# Patient Record
Sex: Male | Born: 1965 | Race: White | Hispanic: No | Marital: Married | Smoking: Current every day smoker
Health system: Southern US, Community
[De-identification: ages and names within clinical notes are randomized; demographics above are authoritative.]

## PROBLEM LIST (undated history)

## (undated) DIAGNOSIS — B182 Chronic viral hepatitis C: Secondary | ICD-10-CM

## (undated) DIAGNOSIS — M1990 Unspecified osteoarthritis, unspecified site: Secondary | ICD-10-CM

## (undated) HISTORY — PX: WRIST ARTHROSCOPY: SUR100

## (undated) HISTORY — DX: Chronic viral hepatitis C: B18.2

## (undated) HISTORY — PX: WRIST SURGERY: SHX841

## (undated) HISTORY — PX: HERNIA REPAIR: SHX51

---

## 2016-01-10 ENCOUNTER — Ambulatory Visit (HOSPITAL_COMMUNITY)
Admission: EM | Admit: 2016-01-10 | Discharge: 2016-01-10 | Disposition: A | Discharge status: Home / Self Care | Payer: Medicaid Other | Attending: Emergency Medicine | Admitting: Emergency Medicine

## 2016-01-10 ENCOUNTER — Encounter (HOSPITAL_COMMUNITY): Payer: Self-pay | Admitting: Emergency Medicine

## 2016-01-10 DIAGNOSIS — M7122 Synovial cyst of popliteal space [Baker], left knee: Secondary | ICD-10-CM | POA: Diagnosis not present

## 2016-01-10 DIAGNOSIS — L409 Psoriasis, unspecified: Secondary | ICD-10-CM | POA: Diagnosis not present

## 2016-01-10 HISTORY — DX: Unspecified osteoarthritis, unspecified site: M19.90

## 2016-01-10 NOTE — Discharge Instructions (Signed)
Baker Cyst °A Baker cyst is a sac-like structure that forms in the back of the knee. It is filled with the same fluid that is located in your knee. This fluid lubricates the bones and cartilage of the knee and allows them to move over each other more easily. °CAUSES  °When the knee becomes injured or inflamed, increased fluid forms in the knee. When this happens, the joint lining is pushed out behind the knee and forms the Baker cyst. This cyst may also be caused by inflammation from arthritic conditions and infections. °SIGNS AND SYMPTOMS  °A Baker cyst usually has no symptoms. When the cyst is substantially enlarged: °· You may feel pressure behind the knee, stiffness in the knee, or a mass in the area behind the knee. °· You may develop pain, redness, and swelling in the calf.  This can suggest a blood clot and requires evaluation by your health care provider. °DIAGNOSIS  °A Baker cyst is most often found during an ultrasound exam. This exam may have been performed for other reasons, and the cyst was found incidentally. Sometimes an MRI is used. This picks up other problems within a joint that an ultrasound exam may not. If the Baker cyst developed immediately after an injury, X-ray exams may be used to diagnose the cyst. °TREATMENT  °The treatment depends on the cause of the cyst. Anti-inflammatory medicines and rest often will be prescribed. If the cyst is caused by a bacterial infection, antibiotic medicines may be prescribed.  °HOME CARE INSTRUCTIONS  °· If the cyst was caused by an injury, for the first 24 hours, keep the injured leg elevated on 2 pillows while lying down. °· For the first 24 hours while you are awake, apply ice to the injured area: °¨ Put ice in a plastic bag. °¨ Place a towel between your skin and the bag. °¨ Leave the ice on for 20 minutes, 2-3 times a day. °· Only take over-the-counter or prescription medicines for pain, discomfort, or fever as directed by your health care  provider. °· Only take antibiotic medicine as directed. Make sure to finish it even if you start to feel better. °MAKE SURE YOU:  °· Understand these instructions. °· Will watch your condition. °· Will get help right away if you are not doing well or get worse. °  °This information is not intended to replace advice given to you by your health care provider. Make sure you discuss any questions you have with your health care provider. °  °Document Released: 08/23/2005 Document Revised: 06/13/2013 Document Reviewed: 04/04/2013 °Elsevier Interactive Patient Education ©2016 [employer] ° °

## 2016-01-10 NOTE — ED Provider Notes (Signed)
CSN: [id number]     Arrival date & time 01/10/16  1315 History   First MD Initiated Contact with Patient 01/10/16 1440     Chief Complaint  Patient presents with  . Mass   (Consider location/radiation/quality/duration/timing/severity/associated sxs/prior Treatment) HPI Comments: Patient presents with left knee "lump" x 4 months. Minimal pain, concerned. No decrease in ROM. History of multiple joint pain. Known psoriasis.   The history is provided by the patient.    Past Medical History  Diagnosis Date  . Arthritis    History reviewed. No pertinent past surgical history. No family history on file. Social History  Substance Use Topics  . Smoking status: Current Every Day Smoker  . Smokeless tobacco: None  . Alcohol Use: Yes    Review of Systems  Constitutional: Negative for fever.  Musculoskeletal: Positive for back pain, joint swelling and arthralgias.  Skin: Positive for rash.       Longstanding psoriasis    Allergies  Review of patient's allergies indicates not on file.  Home Medications   Prior to Admission medications   Not on File   Meds Ordered and Administered this Visit  Medications - No data to display  BP 141/69 mmHg  Pulse 72  Temp(Src) 97.9 F (36.6 C) (Oral)  Resp 16  SpO2 100% No data found.   Physical Exam  Constitutional: He is oriented to person, place, and time. He appears well-developed and well-nourished. No distress.  Very anxious, would not sit, kept wandering out into the hall  Musculoskeletal: He exhibits edema and tenderness.  Left tennis ball sized baker's cyst to the posterior knee, no warmth or erythema, no joint effusion, synovitis in the hands and ankles  Neurological: He is alert and oriented to person, place, and time.  Skin: Skin is warm. Rash noted. He is not diaphoretic.  Plaque psoriasis to most extensor surfaces visible by exam  Psychiatric:  Anxious appearing  Nursing note and vitals reviewed.   ED Course   Procedures (including critical care time)  Labs Review Labs Reviewed - No data to display  Imaging Review No results found.   Visual Acuity Review  Right Eye Distance:   Left Eye Distance:   Bilateral Distance:    Right Eye Near:   Left Eye Near:    Bilateral Near:         MDM   1. Baker's cyst of knee, left   2. Psoriasis    Orthopedic information given for drain vs. Surgical options. No emergent needs and no signs of infection.  2. Severe and probable associated PsA by exam. Recommend rheumatology, but patient is having insurance issues and states he will "do this later".     Bjorn Pippin, PA-C 01/10/16 1558

## 2016-01-10 NOTE — ED Notes (Signed)
Pt. Stated, I've had this lump behind left knee for 4 months. No pain associated.

## 2016-06-04 ENCOUNTER — Emergency Department (HOSPITAL_COMMUNITY)
Admission: EM | Admit: 2016-06-04 | Discharge: 2016-06-04 | Disposition: A | Discharge status: Home / Self Care | Payer: Medicaid Other | Attending: Emergency Medicine | Admitting: Emergency Medicine

## 2016-06-04 ENCOUNTER — Encounter (HOSPITAL_COMMUNITY): Payer: Self-pay | Admitting: Emergency Medicine

## 2016-06-04 DIAGNOSIS — F17210 Nicotine dependence, cigarettes, uncomplicated: Secondary | ICD-10-CM | POA: Diagnosis not present

## 2016-06-04 DIAGNOSIS — J0100 Acute maxillary sinusitis, unspecified: Secondary | ICD-10-CM | POA: Diagnosis not present

## 2016-06-04 DIAGNOSIS — R0981 Nasal congestion: Secondary | ICD-10-CM | POA: Diagnosis present

## 2016-06-04 MED ORDER — AMOXICILLIN 500 MG PO CAPS: 500.0000 mg | ORAL_CAPSULE | Freq: Three times a day (TID) | ORAL | 0 refills | Status: DC

## 2016-06-04 NOTE — ED Triage Notes (Signed)
Pt reports head congestion x 2.5 weeks. Pt reports fever at first, none today.

## 2016-06-05 NOTE — ED Provider Notes (Signed)
[facility] DEPT Provider Note   CSN: [id number] Arrival date & time: 06/04/16  1314     History   Chief Complaint Chief Complaint  Patient presents with  . Nasal Congestion    HPI Drew Kent is a 50 y.o. male.  The history is provided by the patient. No language interpreter was used.  Cough  This is a new problem. The problem occurs constantly. The cough is non-productive. There has been no fever. Associated symptoms include sore throat. He has tried nothing for the symptoms. He is a smoker. His past medical history does not include asthma.  Pt complains of sinus drainage and congestion,    Past Medical History:  Diagnosis Date  . Arthritis     There are no active problems to display for this patient.   Past Surgical History:  Procedure Laterality Date  . HERNIA REPAIR    . WRIST ARTHROSCOPY         Home Medications    Prior to Admission medications   Medication Sig Start Date End Date Taking? Authorizing Provider  amoxicillin (AMOXIL) 500 MG capsule Take 1 capsule (500 mg total) by mouth 3 (three) times daily. 06/04/16   Fransico Meadow, PA-C    Family History Family History  Problem Relation Age of Onset  . Heart failure Mother   . Cancer Other   . Diabetes Other   . Heart failure Other     Social History Social History  Substance Use Topics  . Smoking status: Current Some Day Smoker    Types: Cigarettes  . Smokeless tobacco: Never Used  . Alcohol use Yes     Comment: occas     Allergies   Review of patient's allergies indicates no known allergies.   Review of Systems Review of Systems  HENT: Positive for facial swelling, sinus pressure and sore throat.   Respiratory: Positive for cough.   All other systems reviewed and are negative.    Physical Exam Updated Vital Signs BP (!) 158/106 (BP Location: Left Arm) Comment: pt reports does not take bp medication regularly.  Pulse 83   Temp 98.9 F (37.2 C) (Oral)   Resp 18   Ht 6'  2.5" (1.892 m)   Wt 90.7 kg   SpO2 100%   BMI 25.33 kg/m   Physical Exam  Constitutional: He appears well-developed and well-nourished.  HENT:  Head: Normocephalic.  Nose: Nose normal.  Tender maxillary sinuses,     Eyes: Conjunctivae are normal.  Neck: Neck supple.  Cardiovascular: Normal rate and regular rhythm.   No murmur heard. Pulmonary/Chest: Effort normal and breath sounds normal. No respiratory distress.  Abdominal: Soft. There is no tenderness.  Musculoskeletal: He exhibits no edema.  Neurological: He is alert.  Skin: Skin is warm and dry.  Psychiatric: He has a normal mood and affect.  Nursing note and vitals reviewed.    ED Treatments / Results  Labs (all labs ordered are listed, but only abnormal results are displayed) Labs Reviewed - No data to display  EKG  EKG Interpretation None       Radiology No results found.  Procedures Procedures (including critical care time)  Medications Ordered in ED Medications - No data to display   Initial Impression / Assessment and Plan / ED Course  I have reviewed the triage vital signs and the nursing notes.  Pertinent labs & imaging results that were available during my care of the patient were reviewed by me and considered in my  medical decision making (see chart for details).  Clinical Course      Final Clinical Impressions(s) / ED Diagnoses   Final diagnoses:  Acute maxillary sinusitis, recurrence not specified    New Prescriptions Discharge Medication List as of 06/04/2016  2:46 PM    START taking these medications   Details  amoxicillin (AMOXIL) 500 MG capsule Take 1 capsule (500 mg total) by mouth 3 (three) times daily., Starting Fri 06/04/2016, Print         Hollace Kinnier Crook, PA-C 06/05/16 [id number]    Dorie Rank, MD 06/06/16 [phone]

## 2018-09-04 ENCOUNTER — Encounter (HOSPITAL_COMMUNITY): Payer: Self-pay

## 2018-09-04 ENCOUNTER — Emergency Department (HOSPITAL_COMMUNITY)
Admission: EM | Admit: 2018-09-04 | Discharge: 2018-09-04 | Disposition: A | Discharge status: Home / Self Care | Payer: Medicaid Other | Attending: Emergency Medicine | Admitting: Emergency Medicine

## 2018-09-04 ENCOUNTER — Other Ambulatory Visit: Payer: Self-pay

## 2018-09-04 DIAGNOSIS — R21 Rash and other nonspecific skin eruption: Secondary | ICD-10-CM

## 2018-09-04 DIAGNOSIS — F17210 Nicotine dependence, cigarettes, uncomplicated: Secondary | ICD-10-CM | POA: Insufficient documentation

## 2018-09-04 MED ORDER — TRIAMCINOLONE ACETONIDE 0.1 % EX CREA: 1.0000 "application " | TOPICAL_CREAM | Freq: Two times a day (BID) | CUTANEOUS | 0 refills | Status: DC

## 2018-09-04 MED ORDER — HYDROXYZINE HCL 25 MG PO TABS: 25.0000 mg | ORAL_TABLET | Freq: Four times a day (QID) | ORAL | 0 refills | Status: DC | PRN

## 2018-09-04 MED ORDER — HYDROXYZINE HCL 25 MG PO TABS
25.0000 mg | ORAL_TABLET | Freq: Once | ORAL | Status: AC
  Administered 2018-09-04: 25 mg via ORAL
  Filled 2018-09-04: qty 1

## 2018-09-04 MED ORDER — DEXAMETHASONE 4 MG PO TABS
12.0000 mg | ORAL_TABLET | Freq: Once | ORAL | Status: AC
  Administered 2018-09-04: 12 mg via ORAL
  Filled 2018-09-04: qty 3

## 2018-09-04 NOTE — ED Triage Notes (Signed)
Pt reports generalized rash off and on for the past 6 or 7 years.  Reports came from exposure to chemical when working years ago.    Reports drinking etoh makes it worse.  Pt currently at [facility] and they wanted him evaluated before he returned.

## 2018-09-04 NOTE — ED Provider Notes (Signed)
[facility] EMERGENCY DEPARTMENT Provider Note   CSN: [id number] Arrival date & time: 09/04/18  1648     History   Chief Complaint Chief Complaint  Patient presents with  . Rash    HPI Drew Kent is a 52 y.o. male.  HPI   52 year old male with rash.  He reports that he has had this rash has been waxing and waning over the past 6 to 7 years.  Primarily on his elbows, forearms and knees.  He attributes this to a chemical exposure at work many years ago.  He feels like it gets worse when he drinks alcohol.  He is please try to get alcohol rehab and they wanted him evaluated.  He does report that his itching is been worse within the past several days.  No new exposures that he is aware of.  Past Medical History:  Diagnosis Date  . Arthritis     There are no active problems to display for this patient.   Past Surgical History:  Procedure Laterality Date  . HERNIA REPAIR    . WRIST ARTHROSCOPY          Home Medications    Prior to Admission medications   Medication Sig Start Date End Date Taking? Authorizing Provider  amoxicillin (AMOXIL) 500 MG capsule Take 1 capsule (500 mg total) by mouth 3 (three) times daily. 06/04/16   Fransico Meadow, PA-C    Family History Family History  Problem Relation Age of Onset  . Heart failure Mother   . Cancer Other   . Diabetes Other   . Heart failure Other     Social History Social History   Tobacco Use  . Smoking status: Current Some Day Smoker    Types: Cigarettes  . Smokeless tobacco: Never Used  Substance Use Topics  . Alcohol use: Yes    Comment: heavily, none in 3 weeks  . Drug use: No     Allergies   Patient has no known allergies.   Review of Systems Review of Systems   Physical Exam Updated Vital Signs BP 112/81 (BP Location: Right Arm)   Pulse 83   Temp 98 F (36.7 C) (Oral)   Resp 19   Ht 6' 2.25" (1.886 m)   Wt 122.5 kg   SpO2 97%   BMI 34.43 kg/m   Physical Exam Vitals signs and  nursing note reviewed.  Constitutional:      General: He is not in acute distress.    Appearance: He is well-developed.  HENT:     Head: Normocephalic and atraumatic.  Eyes:     General:        Right eye: No discharge.        Left eye: No discharge.     Conjunctiva/sclera: Conjunctivae normal.  Neck:     Musculoskeletal: Neck supple.  Cardiovascular:     Rate and Rhythm: Normal rate and regular rhythm.     Heart sounds: Normal heart sounds. No murmur. No friction rub. No gallop.   Pulmonary:     Effort: Pulmonary effort is normal. No respiratory distress.     Breath sounds: Normal breath sounds.  Abdominal:     General: There is no distension.     Palpations: Abdomen is soft.     Tenderness: There is no abdominal tenderness.  Musculoskeletal:        General: No tenderness.  Skin:    General: Skin is warm and dry.     Findings: Rash present.  Comments: Skin changes consistent with eczema elbows and bilateral knees.  More acute appearing lesions and excoriations to bilateral forearms.  No cellulitic changes.  Neurological:     Mental Status: He is alert.  Psychiatric:        Behavior: Behavior normal.        Thought Content: Thought content normal.      ED Treatments / Results  Labs (all labs ordered are listed, but only abnormal results are displayed) Labs Reviewed - No data to display  EKG None  Radiology No results found.  Procedures Procedures (including critical care time)  Medications Ordered in ED Medications - No data to display   Initial Impression / Assessment and Plan / ED Course  I have reviewed the triage vital signs and the nursing notes.  Pertinent labs & imaging results that were available during my care of the patient were reviewed by me and considered in my medical decision making (see chart for details).     52 year old male with a nonspecific dermatitis which appears to be superimposed on most likely psoriasis.  Plan symptomatic  treatment.  No obvious new exposures.  No systemic symptoms.  Final Clinical Impressions(s) / ED Diagnoses   Final diagnoses:  Rash    ED Discharge Orders    None       Virgel Manifold, MD 09/16/18 [phone]

## 2018-09-05 DIAGNOSIS — Z139 Encounter for screening, unspecified: Secondary | ICD-10-CM

## 2018-09-05 LAB — GLUCOSE, POCT (MANUAL RESULT ENTRY): POC Glucose: 99 mg/dl (ref 70–99)

## 2018-09-05 NOTE — Congregational Nurse Program (Addendum)
  Dept: [phone]   Congregational Nurse Program Note  Date of Encounter: 09/05/2018  Past Medical History: Past Medical History:  Diagnosis Date  . Arthritis     Encounter Details:  Pt presents to clinic to receive medical screening for PCP as a requirement by the [facility] to receive a physical exam per the facility   Vital signs checked;  Random Blood Glucose conducted (WNL); Pulse (ABN); Pt states he was excited; Temp (ABN) -pt states he had been experiening diarrhea like symptoms for 11 days most recently.   ABN Vitals were were reviewed with RN Case Manager.           Marland Kitchen      Referral to RN Case Manager, Mayra Reel was  provided to setup appointment for PCP and dental care services..   Development worker, community, Lynelle Smoke,  Conducted  financially elgibility for [employer] program.     Pt will be contacted back with a phone call to receive medical appt due to [facility] being  closed during the holidays.

## 2018-09-14 ENCOUNTER — Ambulatory Visit: Payer: Self-pay | Admitting: Physician Assistant

## 2018-09-27 ENCOUNTER — Ambulatory Visit: Payer: Self-pay | Admitting: Physician Assistant

## 2018-09-27 ENCOUNTER — Encounter: Payer: Self-pay | Admitting: Physician Assistant

## 2018-09-27 VITALS — BP 128/77 | HR 82 | Temp 97.7°F | Ht 73.0 in | Wt 290.0 lb

## 2018-09-27 DIAGNOSIS — E669 Obesity, unspecified: Secondary | ICD-10-CM

## 2018-09-27 DIAGNOSIS — L989 Disorder of the skin and subcutaneous tissue, unspecified: Secondary | ICD-10-CM

## 2018-09-27 DIAGNOSIS — Z125 Encounter for screening for malignant neoplasm of prostate: Secondary | ICD-10-CM

## 2018-09-27 DIAGNOSIS — Z7689 Persons encountering health services in other specified circumstances: Secondary | ICD-10-CM

## 2018-09-27 DIAGNOSIS — F17200 Nicotine dependence, unspecified, uncomplicated: Secondary | ICD-10-CM

## 2018-09-27 DIAGNOSIS — Z131 Encounter for screening for diabetes mellitus: Secondary | ICD-10-CM

## 2018-09-27 DIAGNOSIS — Z8619 Personal history of other infectious and parasitic diseases: Secondary | ICD-10-CM

## 2018-09-27 DIAGNOSIS — Z13220 Encounter for screening for lipoid disorders: Secondary | ICD-10-CM

## 2018-09-27 DIAGNOSIS — F1011 Alcohol abuse, in remission: Secondary | ICD-10-CM | POA: Insufficient documentation

## 2018-09-27 MED ORDER — TRIAMCINOLONE ACETONIDE 0.025 % EX OINT: 1.0000 "application " | TOPICAL_OINTMENT | Freq: Two times a day (BID) | CUTANEOUS | 0 refills | Status: DC

## 2018-09-27 NOTE — Progress Notes (Signed)
BP 128/77 (BP Location: Right Arm, Patient Position: Sitting, Cuff Size: Large)   Pulse 82   Temp 97.7 F (36.5 C) (Other (Comment))   Ht 6\' 1"  (1.854 m)   Wt 290 lb (131.5 kg)   SpO2 97%   BMI 38.26 kg/m    Subjective:    Patient ID: Drew Kent, male    DOB: 1965/09/16, 53 y.o.   MRN: [id number]  HPI: Jah Alarid is a 53 y.o. male presenting on 09/27/2018 for New Patient (Initial Visit)   HPI   Pt is here today to establish care.  Pt was at [facility] recently to get help with his alcoholism but is no longer there.  He is no longer there because he broke rules.  He is currently going to [facility] and is getting treatment.   He says currently he is not drinking.  Pt used cocaine in the past (including IVDU).  While he was in his 19s.    He has hepatitis C.  He was diagnosed about 7 years ago in [place].  He received no treatment at that time.   Relevant past medical, surgical, family and social history reviewed and updated as indicated. Interim medical history since our last visit reviewed. Allergies and medications reviewed and updated.  CURRENT MEDS: none  Review of Systems  Constitutional: Positive for fatigue. Negative for appetite change, chills, diaphoresis, fever and unexpected weight change.  HENT: Positive for dental problem. Negative for congestion, drooling, ear pain, facial swelling, hearing loss, mouth sores, sneezing, sore throat, trouble swallowing and voice change.   Eyes: Negative for pain, discharge, redness, itching and visual disturbance.  Respiratory: Negative for cough, choking, shortness of breath and wheezing.   Cardiovascular: Negative for chest pain, palpitations and leg swelling.  Gastrointestinal: Positive for abdominal pain. Negative for blood in stool, constipation, diarrhea and vomiting.  Endocrine: Negative for cold intolerance, heat intolerance and polydipsia.  Genitourinary: Negative for decreased urine volume, dysuria and hematuria.    Musculoskeletal: Positive for arthralgias and back pain. Negative for gait problem.  Skin: Positive for rash.  Allergic/Immunologic: Negative for environmental allergies.  Neurological: Negative for seizures, syncope, light-headedness and headaches.  Hematological: Negative for adenopathy.  Psychiatric/Behavioral: Negative for agitation, dysphoric mood and suicidal ideas. The patient is not nervous/anxious.     Per HPI unless specifically indicated above     Objective:    BP 128/77 (BP Location: Right Arm, Patient Position: Sitting, Cuff Size: Large)   Pulse 82   Temp 97.7 F (36.5 C) (Other (Comment))   Ht 6\' 1"  (1.854 m)   Wt 290 lb (131.5 kg)   SpO2 97%   BMI 38.26 kg/m   Wt Readings from Last 3 Encounters:  09/27/18 290 lb (131.5 kg)  09/05/18 280 lb (127 kg)  09/04/18 270 lb (122.5 kg)    Physical Exam Vitals signs reviewed.  Constitutional:      Appearance: He is well-developed.  HENT:     Head: Normocephalic and atraumatic.     Mouth/Throat:     Pharynx: No oropharyngeal exudate.  Eyes:     Conjunctiva/sclera: Conjunctivae normal.     Pupils: Pupils are equal, round, and reactive to light.  Neck:     Musculoskeletal: Neck supple.     Thyroid: No thyromegaly.  Cardiovascular:     Rate and Rhythm: Normal rate and regular rhythm.  Pulmonary:     Effort: Pulmonary effort is normal.     Breath sounds: Normal breath sounds. No wheezing  or rales.  Abdominal:     General: Bowel sounds are normal.     Palpations: Abdomen is soft. There is no mass.     Tenderness: There is no abdominal tenderness.  Lymphadenopathy:     Cervical: No cervical adenopathy.  Skin:    General: Skin is warm and dry.     Findings: Rash present. Rash is crusting and scaling.          Comments: Extensive plaque eruption.  As per diagram. Also in/on ears.  Face mostly spared  Neurological:     Mental Status: He is alert and oriented to person, place, and time.  Psychiatric:         Behavior: Behavior normal.        Thought Content: Thought content normal.         Assessment & Plan:   Encounter Diagnoses  Name Primary?  . Encounter to establish care Yes  . Alcohol abuse, in remission   . History of hepatitis C   . Psoriasis-like skin disease   . Obesity, unspecified classification, unspecified obesity type, unspecified whether serious comorbidity present   . Tobacco use disorder   . Screening cholesterol level   . Screening for diabetes mellitus   . Screening for prostate cancer     -will get Baseline labs -Refer to derm- eczema vs psoriasis -rx TAC ointment to use for now.  Counseled pt to stop using [place] soap and use Dove only as it is milder -pt to continue with AA for alcoholism -pt to follow up here 1 month.  RTO sooner prn

## 2018-10-10 ENCOUNTER — Other Ambulatory Visit: Payer: Self-pay

## 2018-10-10 ENCOUNTER — Emergency Department (HOSPITAL_COMMUNITY)
Admission: EM | Admit: 2018-10-10 | Discharge: 2018-10-10 | Disposition: A | Discharge status: Home / Self Care | Payer: Self-pay | Attending: Emergency Medicine | Admitting: Emergency Medicine

## 2018-10-10 ENCOUNTER — Encounter (HOSPITAL_COMMUNITY): Payer: Self-pay | Admitting: Emergency Medicine

## 2018-10-10 DIAGNOSIS — Y999 Unspecified external cause status: Secondary | ICD-10-CM | POA: Insufficient documentation

## 2018-10-10 DIAGNOSIS — S61412A Laceration without foreign body of left hand, initial encounter: Secondary | ICD-10-CM | POA: Insufficient documentation

## 2018-10-10 DIAGNOSIS — F17210 Nicotine dependence, cigarettes, uncomplicated: Secondary | ICD-10-CM | POA: Insufficient documentation

## 2018-10-10 DIAGNOSIS — Y93G1 Activity, food preparation and clean up: Secondary | ICD-10-CM | POA: Insufficient documentation

## 2018-10-10 DIAGNOSIS — Z23 Encounter for immunization: Secondary | ICD-10-CM | POA: Insufficient documentation

## 2018-10-10 DIAGNOSIS — Y929 Unspecified place or not applicable: Secondary | ICD-10-CM | POA: Insufficient documentation

## 2018-10-10 DIAGNOSIS — W269XXA Contact with unspecified sharp object(s), initial encounter: Secondary | ICD-10-CM | POA: Insufficient documentation

## 2018-10-10 MED ORDER — LIDOCAINE HCL (PF) 2 % IJ SOLN
INTRAMUSCULAR | Status: AC
  Filled 2018-10-10: qty 10

## 2018-10-10 MED ORDER — POVIDONE-IODINE 10 % EX SOLN
CUTANEOUS | Status: DC | PRN
  Administered 2018-10-10: 1 via TOPICAL

## 2018-10-10 MED ORDER — TETANUS-DIPHTH-ACELL PERTUSSIS 5-2.5-18.5 LF-MCG/0.5 IM SUSP
0.5000 mL | Freq: Once | INTRAMUSCULAR | Status: AC
  Administered 2018-10-10: 0.5 mL via INTRAMUSCULAR
  Filled 2018-10-10: qty 0.5

## 2018-10-10 MED ORDER — POVIDONE-IODINE 10 % EX SOLN
CUTANEOUS | Status: AC
  Filled 2018-10-10: qty 15

## 2018-10-10 MED ORDER — LIDOCAINE HCL (PF) 2 % IJ SOLN
5.0000 mL | Freq: Once | INTRAMUSCULAR | Status: AC
  Administered 2018-10-10: 5 mL via INTRADERMAL

## 2018-10-10 NOTE — ED Triage Notes (Signed)
Patient was shucking oysters and cut left hand between thumb and index finger, bleeding control, last Tetanus shot 8 years ago.

## 2018-10-10 NOTE — Discharge Instructions (Addendum)
Clean the wound with mild soap and water and keep it bandaged.  Avoid using your left hand.  Sutures out in 8 to 10 days.  Return here for any signs of infection such as increasing pain, redness or red streaking, swelling or drainage.  Elevate your hand tonight to help with pain and swelling.

## 2018-10-10 NOTE — ED Provider Notes (Signed)
[facility] EMERGENCY DEPARTMENT Provider Note   CSN: [id number] Arrival date & time: 10/10/18  2000     History   Chief Complaint Chief Complaint  Patient presents with  . Extremity Laceration    HPI Drew Kent is a 53 y.o. male.  HPI  Drew Kent is a 53 y.o. male who presents to the Emergency Department complaining of laceration to his left hand.  Incident occurred shortly before ER arrival.  States that he was shucking oysters and punctured the area between the thumb and index finger of the left hand.  He reports throbbing pain and small laceration.  Mild bleeding that has been controlled with direct pressure.  Last TD is unknown.  He denies possible FB's, numbness, weakness or difficulty moving the fingers of the affected hand. He does not take blood thinners   Past Medical History:  Diagnosis Date  . Arthritis     Patient Active Problem List   Diagnosis Date Noted  . 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 HERNIA REPAIR    . WRIST ARTHROSCOPY          Home Medications    Prior to Admission medications   Medication Sig Start Date End Date Taking? Authorizing Provider  triamcinolone (KENALOG) 0.025 % ointment Apply 1 application topically 2 (two) times daily. 09/27/18   Soyla Dryer, PA-C    Family History Family History  Problem Relation Age of Onset  . Heart failure Mother   . Heart disease Mother   . Cancer Other   . Diabetes Other   . Heart failure Other     Social History Social History   Tobacco Use  . Smoking status: Current Every Day Smoker    Packs/day: 0.50    Types: Cigarettes, E-cigarettes  . Smokeless tobacco: Never Used  Substance Use Topics  . Alcohol use: Yes    Comment: alcoholic  . Drug use: Yes    Types: Cocaine     Allergies   Patient has no known allergies.   Review of Systems Review of Systems  Constitutional: Negative for chills and fever.    Musculoskeletal: Negative for arthralgias and joint swelling.  Skin: Positive for wound.       Laceration left hand  Neurological: Negative for weakness and numbness.  Hematological: Does not bruise/bleed easily.     Physical Exam Updated Vital Signs BP 111/85 (BP Location: Right Arm)   Pulse 97   Temp 98.6 F (37 C) (Oral)   Resp 17   Ht 6\' 1"  (1.854 m)   Wt 131.5 kg   SpO2 98%   BMI 38.26 kg/m   Physical Exam Vitals signs and nursing note reviewed.  Constitutional:      General: He is not in acute distress.    Appearance: Normal appearance. He is well-developed.  HENT:     Head: Atraumatic.  Cardiovascular:     Rate and Rhythm: Normal rate and regular rhythm.     Pulses: Normal pulses.  Pulmonary:     Effort: Pulmonary effort is normal. No respiratory distress.     Breath sounds: Normal breath sounds.  Musculoskeletal: Normal range of motion.        General: Tenderness and signs of injury present. No swelling.     Left hand: He exhibits tenderness and laceration. He exhibits no bony tenderness, normal capillary refill, no deformity and no swelling. Normal sensation noted. Normal strength noted. He exhibits no  finger abduction and no wrist extension trouble.       Hands:     Comments: 1.5 cm laceration to the first web space of the left hand.  No edema, bleeding controlled prior to closure. Pt has full ROM of the fingers of the left hand.  No sensory deficits.  Skin:    General: Skin is warm.     Capillary Refill: Capillary refill takes less than 2 seconds.     Findings: No laceration.  Neurological:     General: No focal deficit present.     Mental Status: He is alert. Mental status is at baseline.     Sensory: Sensation is intact. No sensory deficit.     Motor: Motor function is intact. No abnormal muscle tone.      ED Treatments / Results  Labs (all labs ordered are listed, but only abnormal results are displayed) Labs Reviewed - No data to  display  EKG None  Radiology No results found.  Procedures Procedures (including critical care time)  LACERATION REPAIR Performed by: Rolla Kedzierski Authorized by: Elvira Langston Consent: Verbal consent obtained. Risks and benefits: risks, benefits and alternatives were discussed Consent given by: patient Patient identity confirmed: provided demographic data Prepped and Draped in normal sterile fashion Wound explored  Laceration Location: First webspace of the left hand  Laceration Length: 1.5 cm  No Foreign Bodies seen or palpated  Anesthesia: local infiltration  Local anesthetic: lidocaine 2% w/o epinephrine  Anesthetic total: 2 ml  Irrigation method: syringe Amount of cleaning: standard  Skin closure: 4-0 Ethilon  Number of sutures: 2  Technique: Simple interrupted  Patient tolerance: Patient tolerated the procedure well with no immediate complications.   Medications Ordered in ED Medications  Tdap (BOOSTRIX) injection 0.5 mL (has no administration in time range)  povidone-iodine (BETADINE) 10 % external solution (1 application Topical Given 10/10/18 2212)  lidocaine (XYLOCAINE) 2 % injection 5 mL (5 mLs Intradermal Given 10/10/18 2211)     Initial Impression / Assessment and Plan / ED Course  I have reviewed the triage vital signs and the nursing notes.  Pertinent labs & imaging results that were available during my care of the patient were reviewed by me and considered in my medical decision making (see chart for details).     Laceration to the first webspace of the left hand.  Bleeding controlled prior to closure.  Wound was irrigated and explored, no foreign body seen.  Neurovascularly intact.  No motor or sensory weakness or deficit.  TD updated.  Laceration was loosely approximated.  Patient agrees to wound care instructions and sutures out in 8 to 10 days.  Final Clinical Impressions(s) / ED Diagnoses   Final diagnoses:  Laceration of left hand  without foreign body, initial encounter    ED Discharge Orders    None       Kem Parkinson, PA-C 10/11/18 1340    Fredia Sorrow, MD 10/11/18 1547

## 2018-10-11 ENCOUNTER — Encounter: Payer: Self-pay | Admitting: Student

## 2018-10-26 ENCOUNTER — Other Ambulatory Visit (HOSPITAL_COMMUNITY)
Admission: RE | Admit: 2018-10-26 | Discharge: 2018-10-26 | Disposition: A | Discharge status: Home / Self Care | Payer: Self-pay | Source: Ambulatory Visit | Attending: Physician Assistant | Admitting: Physician Assistant

## 2018-10-26 DIAGNOSIS — Z8619 Personal history of other infectious and parasitic diseases: Secondary | ICD-10-CM | POA: Insufficient documentation

## 2018-10-26 DIAGNOSIS — F1011 Alcohol abuse, in remission: Secondary | ICD-10-CM | POA: Insufficient documentation

## 2018-10-26 DIAGNOSIS — Z125 Encounter for screening for malignant neoplasm of prostate: Secondary | ICD-10-CM | POA: Insufficient documentation

## 2018-10-26 DIAGNOSIS — Z131 Encounter for screening for diabetes mellitus: Secondary | ICD-10-CM | POA: Insufficient documentation

## 2018-10-26 DIAGNOSIS — Z13220 Encounter for screening for lipoid disorders: Secondary | ICD-10-CM | POA: Insufficient documentation

## 2018-10-26 LAB — LIPID PANEL
Cholesterol: 204 mg/dL — ABNORMAL HIGH (ref 0–200)
HDL: 35 mg/dL — ABNORMAL LOW
LDL Cholesterol: 154 mg/dL — ABNORMAL HIGH (ref 0–99)
Total CHOL/HDL Ratio: 5.8 ratio
Triglycerides: 77 mg/dL
VLDL: 15 mg/dL (ref 0–40)

## 2018-10-26 LAB — COMPREHENSIVE METABOLIC PANEL WITH GFR
ALT: 71 U/L — ABNORMAL HIGH (ref 0–44)
AST: 40 U/L (ref 15–41)
Albumin: 4.3 g/dL (ref 3.5–5.0)
Alkaline Phosphatase: 57 U/L (ref 38–126)
Anion gap: 8 (ref 5–15)
BUN: 17 mg/dL (ref 6–20)
CO2: 23 mmol/L (ref 22–32)
Calcium: 9.2 mg/dL (ref 8.9–10.3)
Chloride: 106 mmol/L (ref 98–111)
Creatinine, Ser: 1.16 mg/dL (ref 0.61–1.24)
GFR calc Af Amer: >60 mL/min
GFR calc non Af Amer: >60 mL/min
Glucose, Bld: 102 mg/dL — ABNORMAL HIGH (ref 70–99)
Potassium: 4.1 mmol/L (ref 3.5–5.1)
Sodium: 137 mmol/L (ref 135–145)
Total Bilirubin: 0.6 mg/dL (ref 0.3–1.2)
Total Protein: 7.8 g/dL (ref 6.5–8.1)

## 2018-10-26 LAB — HEMOGLOBIN A1C
Hgb A1c MFr Bld: 5.3 % (ref 4.8–5.6)
Mean Plasma Glucose: 105.41 mg/dL

## 2018-10-26 LAB — PSA: Prostatic Specific Antigen: 0.26 ng/mL (ref 0.00–4.00)

## 2018-10-27 LAB — HCV RNA QUANT

## 2018-10-27 LAB — HCV RNA (INTERNATIONAL UNITS)
HCV RNA (International Units): 12500000 IU/mL
HCV log10: 7.097 log10 IU/mL

## 2018-10-30 ENCOUNTER — Encounter: Payer: Self-pay | Admitting: Physician Assistant

## 2018-10-30 ENCOUNTER — Ambulatory Visit: Payer: Self-pay | Admitting: Physician Assistant

## 2018-10-30 VITALS — BP 136/86 | HR 86 | Temp 97.7°F | Ht 73.5 in | Wt 297.0 lb

## 2018-10-30 DIAGNOSIS — R945 Abnormal results of liver function studies: Secondary | ICD-10-CM

## 2018-10-30 DIAGNOSIS — L989 Disorder of the skin and subcutaneous tissue, unspecified: Secondary | ICD-10-CM

## 2018-10-30 DIAGNOSIS — E669 Obesity, unspecified: Secondary | ICD-10-CM

## 2018-10-30 DIAGNOSIS — E785 Hyperlipidemia, unspecified: Secondary | ICD-10-CM | POA: Insufficient documentation

## 2018-10-30 DIAGNOSIS — F1011 Alcohol abuse, in remission: Secondary | ICD-10-CM

## 2018-10-30 DIAGNOSIS — R7989 Other specified abnormal findings of blood chemistry: Secondary | ICD-10-CM

## 2018-10-30 DIAGNOSIS — B1920 Unspecified viral hepatitis C without hepatic coma: Secondary | ICD-10-CM | POA: Insufficient documentation

## 2018-10-30 DIAGNOSIS — F17200 Nicotine dependence, unspecified, uncomplicated: Secondary | ICD-10-CM

## 2018-10-30 NOTE — Patient Instructions (Signed)

## 2018-10-30 NOTE — Progress Notes (Signed)
BP 136/86 (BP Location: Left Arm, Patient Position: Sitting, Cuff Size: Large)   Pulse 86   Temp 97.7 F (36.5 C)   Ht 6' 1.5" (1.867 m)   Wt 297 lb (134.7 kg)   SpO2 98%   BMI 38.65 kg/m    Subjective:    Patient ID: Drew Kent, male    DOB: 30-Aug-1966, 53 y.o.   MRN: [id number]  HPI: Drew Kent is a 53 y.o. male presenting on 10/30/2018 for Follow-up   HPI  Pt says he has been to dermatology- was given coal tar soaps , etc  Relevant past medical, surgical, family and social history reviewed and updated as indicated. Interim medical history since our last visit reviewed. Allergies and medications reviewed and updated.   Current Outpatient Medications:  .  triamcinolone (KENALOG) 0.025 % ointment, Apply 1 application topically 2 (two) times daily., Disp: 454 g, Rfl: 0     Review of Systems  Constitutional: Negative for appetite change, chills, diaphoresis, fatigue, fever and unexpected weight change.  HENT: Positive for congestion. Negative for dental problem, drooling, ear pain, facial swelling, hearing loss, mouth sores, sneezing, sore throat, trouble swallowing and voice change.   Eyes: Negative for pain, discharge, redness, itching and visual disturbance.  Respiratory: Negative for cough, choking, shortness of breath and wheezing.   Cardiovascular: Negative for chest pain, palpitations and leg swelling.  Gastrointestinal: Negative for abdominal pain, blood in stool, constipation, diarrhea and vomiting.  Endocrine: Negative for cold intolerance, heat intolerance and polydipsia.  Genitourinary: Negative for decreased urine volume, dysuria and hematuria.  Musculoskeletal: Positive for arthralgias, back pain and gait problem.  Skin: Negative for rash.  Allergic/Immunologic: Negative for environmental allergies.  Neurological: Negative for seizures, syncope, light-headedness and headaches.  Hematological: Negative for adenopathy.  Psychiatric/Behavioral: Negative for  agitation, dysphoric mood and suicidal ideas. The patient is not nervous/anxious.     Per HPI unless specifically indicated above     Objective:    BP 136/86 (BP Location: Left Arm, Patient Position: Sitting, Cuff Size: Large)   Pulse 86   Temp 97.7 F (36.5 C)   Ht 6' 1.5" (1.867 m)   Wt 297 lb (134.7 kg)   SpO2 98%   BMI 38.65 kg/m   Wt Readings from Last 3 Encounters:  10/30/18 297 lb (134.7 kg)  10/10/18 290 lb (131.5 kg)  09/27/18 290 lb (131.5 kg)    Physical Exam Vitals signs reviewed.  Constitutional:      Appearance: He is well-developed.  HENT:     Head: Normocephalic and atraumatic.  Neck:     Musculoskeletal: Neck supple.  Cardiovascular:     Rate and Rhythm: Normal rate and regular rhythm.  Pulmonary:     Effort: Pulmonary effort is normal.     Breath sounds: Normal breath sounds. No wheezing.  Abdominal:     General: Bowel sounds are normal.     Palpations: Abdomen is soft.     Tenderness: There is no abdominal tenderness.  Lymphadenopathy:     Cervical: No cervical adenopathy.  Skin:    General: Skin is warm and dry.  Neurological:     Mental Status: He is alert and oriented to person, place, and time.  Psychiatric:        Behavior: Behavior normal.     Results for orders placed or performed during the hospital encounter of 10/26/18  Comprehensive metabolic panel  Result Value Ref Range   Sodium 137 135 - 145 mmol/L  Potassium 4.1 3.5 - 5.1 mmol/L   Chloride 106 98 - 111 mmol/L   CO2 23 22 - 32 mmol/L   Glucose, Bld 102 (H) 70 - 99 mg/dL   BUN 17 6 - 20 mg/dL   Creatinine, Ser 1.16 0.61 - 1.24 mg/dL   Calcium 9.2 8.9 - 10.3 mg/dL   Total Protein 7.8 6.5 - 8.1 g/dL   Albumin 4.3 3.5 - 5.0 g/dL   AST 40 15 - 41 U/L   ALT 71 (H) 0 - 44 U/L   Alkaline Phosphatase 57 38 - 126 U/L   Total Bilirubin 0.6 0.3 - 1.2 mg/dL   GFR calc non Af Amer >60 >60 mL/min   GFR calc Af Amer >60 >60 mL/min   Anion gap 8 5 - 15  Hemoglobin A1c  Result  Value Ref Range   Hgb A1c MFr Bld 5.3 4.8 - 5.6 %   Mean Plasma Glucose 105.41 mg/dL  PSA  Result Value Ref Range   Prostatic Specific Antigen 0.26 0.00 - 4.00 ng/mL  Lipid panel  Result Value Ref Range   Cholesterol 204 (H) 0 - 200 mg/dL   Triglycerides 77 <150 mg/dL   HDL 35 (L) >40 mg/dL   Total CHOL/HDL Ratio 5.8 RATIO   VLDL 15 0 - 40 mg/dL   LDL Cholesterol 154 (H) 0 - 99 mg/dL  HCV RNA quant  Result Value Ref Range   HCV Quantitative See Final Results >50 IU/mL   Test Information Comment   HCV RNA (International Units)  Result Value Ref Range   HCV RNA (International Units) 12,500,000 IU/mL   HCV log10 7.097 log10 IU/mL      Assessment & Plan:   Encounter Diagnoses  Name Primary?  . Hepatitis C virus infection without hepatic coma, unspecified chronicity Yes  . Tobacco use disorder   . Elevated LFTs   . Hyperlipidemia, unspecified hyperlipidemia type   . Alcohol abuse, in remission   . Obesity, unspecified classification, unspecified obesity type, unspecified whether serious comorbidity present   . Psoriasis-like skin disease     -reviewed labs with pt -Refer to GI for hep C -pt is given application for [facility] -counseled pt on Lowfat diet and exercise for lipids -counseled Smoking cessation -pt to follow up 3 months.  RTO sooner prn

## 2018-11-06 ENCOUNTER — Encounter: Payer: Self-pay | Admitting: Gastroenterology

## 2019-01-09 ENCOUNTER — Other Ambulatory Visit: Payer: Self-pay | Admitting: *Deleted

## 2019-01-09 ENCOUNTER — Encounter: Payer: Self-pay | Admitting: Gastroenterology

## 2019-01-09 ENCOUNTER — Telehealth: Payer: Self-pay | Admitting: *Deleted

## 2019-01-09 ENCOUNTER — Other Ambulatory Visit: Payer: Self-pay

## 2019-01-09 ENCOUNTER — Ambulatory Visit (INDEPENDENT_AMBULATORY_CARE_PROVIDER_SITE_OTHER): Payer: Self-pay | Admitting: Nurse Practitioner

## 2019-01-09 ENCOUNTER — Encounter: Payer: Self-pay | Admitting: Nurse Practitioner

## 2019-01-09 DIAGNOSIS — K625 Hemorrhage of anus and rectum: Secondary | ICD-10-CM | POA: Insufficient documentation

## 2019-01-09 DIAGNOSIS — B1920 Unspecified viral hepatitis C without hepatic coma: Secondary | ICD-10-CM

## 2019-01-09 MED ORDER — PEG 3350-KCL-NA BICARB-NACL 420 G PO SOLR: 4000.0000 mL | Freq: Once | ORAL | 0 refills | Status: AC

## 2019-01-09 NOTE — Progress Notes (Addendum)
REVIEWED-NO ADDITIONAL RECOMMENDATIONS.  Referring Provider: Soyla Dryer, PA-C Primary Care Physician:  Soyla Dryer, PA-C Primary GI:  Dr. Oneida Alar  NOTE: Service was provided via telemedicine and was requested by the patient due to COVID-19 pandemic.  Method of visit: Doxy.Me  Patient Location: Home  Provider Location: Office  Reason for Phone Visit: Consult from PCP  The patient was consented to phone follow-up via telephone encounter including billing of the encounter (yes/no): Yes  Persons present on the phone encounter, with roles: Wife  Total time (minutes) spent on medical discussion: 23 minutes  Chief Complaint  Patient presents with  . Hepatitis C    HPI:   Drew Kent is a 53 y.o. male who presents for virtual visit regarding: Referral from primary care for evaluation of hepatitis C.  Reviewed information provided with the referral including office visit with primary care due to 24 2020 for hepatitis C virus, elevated LFTs, alcohol abuse in remission, and others.  Recent LFTs found elevated ALT at 71, AST normal at 40, bilirubin 0.6, alk phos 57.  Hepatitis C antibody was positive and RNA at 12,500,000 copies.  No CBC in our system.  His BMI is 38.  He was counseled on low-fat diet and exercise.  No history of colonoscopy or endoscopy in our system.  Today he states he's doing well overall. Occasional morning-time abdominal discomfort that resolves when he gets up and moves around. Has had rare hematochezia since a child (as often as once every few months). Denies N/V, melena, fever, chills, unintentional weight loss. Denies yellowing of skin/eyes, darkened urine, acute episodic confusion, tremors/shakes. Denies URI and flu-like symptoms. Denies chest pain, dyspnea, dizziness, lightheadedness, syncope, near syncope. Denies any other upper or lower GI symptoms.  Hepatitis C Risk Factors:  Birth cohort ([place]): Yes IV drug use: Yes (last use 2010)  Tattoos: Yes (received in jail) Blood product transfusion: Yes (2008) HC worker: No Hemodialysis: No Maternal infection: No   Past Medical History:  Diagnosis Date  . Arthritis     Past Surgical History:  Procedure Laterality Date  . HERNIA REPAIR    . WRIST ARTHROSCOPY      Current Outpatient Medications  Medication Sig Dispense Refill  . triamcinolone (KENALOG) 0.025 % ointment Apply 1 application topically 2 (two) times daily. 454 g 0   No current facility-administered medications for this visit.     Allergies as of 01/09/2019  . (No Known Allergies)    Family History  Problem Relation Age of Onset  . Heart failure Mother   . Heart disease Mother   . Cancer Other   . Diabetes Other   . Heart failure Other     Social History   Socioeconomic History  . Marital status: Married    Spouse name: Not on file  . Number of children: Not on file  . Years of education: Not on file  . Highest education level: Not on file  Occupational History  . Not on file  Social Needs  . Financial resource strain: Not on file  . Food insecurity:    Worry: Not on file    Inability: Not on file  . Transportation needs:    Medical: Not on file    Non-medical: Not on file  Tobacco Use  . Smoking status: Current Every Day Smoker    Packs/day: 0.25    Types: Cigarettes, E-cigarettes  . Smokeless tobacco: Never Used  Substance and Sexual Activity  . Alcohol use: Not  Currently    Comment: alcoholic  . Drug use: Not Currently    Types: Cocaine  . Sexual activity: Not on file  Lifestyle  . Physical activity:    Days per week: Not on file    Minutes per session: Not on file  . Stress: Not on file  Relationships  . Social connections:    Talks on phone: Not on file    Gets together: Not on file    Attends religious service: Not on file    Active member of club or organization: Not on file    Attends meetings of clubs or organizations: Not on file    Relationship status:  Not on file  Other Topics Concern  . Not on file  Social History Narrative  . Not on file    Review of Systems: Complete ROS negative except as per HPI.  Physical Exam: Note: limited exam due to virtual visit General:   Obese male. Alert and oriented. Pleasant and cooperative. Well-nourished and well-developed.  Head:  Normocephalic and atraumatic. Eyes:  Without icterus, sclera clear and conjunctiva pink.  Ears:  Normal auditory acuity. Skin:  Intact without facial significant lesions or rashes. Neurologic:  Alert and oriented x4;  grossly normal neurologically. Psych:  Alert and cooperative. Normal mood and affect. Heme/Lymph/Immune: No excessive bruising noted.

## 2019-01-09 NOTE — Patient Instructions (Signed)
Your health issues we discussed today were:   Rectal bleeding: 1. Because of these on and off rectal bleeding you have been having, as well as your age (due for colonoscopy regardless) we will plan for colonoscopy to further evaluate 2. Further recommendations will be made after your colonoscopy  Hepatitis C infection: 1. Based on the labs provided by your primary care provider it appears you have hepatitis C infection 2. We will need to complete several additional labs (lab slips enclosed) to determine your treatment options 3. You will also need an ultrasound of your liver and abdomen 4. Further recommendations will follow  Overall I recommend:  1. Continue your current medications 2. Call us if you have any questions or concerns 3. Return for follow-up in 2 months   Because of recent events of COVID-19 ("Coronavirus"), follow CDC recommendations:  1. Wash your hand frequently 2. Avoid touching your face 3. Stay away from people who are sick 4. If you have symptoms such as fever, cough, shortness of breath then call your healthcare provider for further guidance 5. If you are sick, STAY AT HOME unless otherwise directed by your healthcare provider. 6. Follow directions from state and national officials regarding staying safe   At [facility] we value your feedback. You may receive a survey about your visit today. Please share your experience as we strive to create trusting relationships with our patients to provide genuine, compassionate, quality care.  We appreciate your understanding and patience as we review any laboratory studies, imaging, and other diagnostic tests that are ordered as we care for you. Our office policy is 5 business days for review of these results, and any emergent or urgent results are addressed in a timely manner for your best interest. If you do not hear from our office in 1 week, please contact us.   We also encourage the use of MyChart,  which contains your medical information for your review as well. If you are not enrolled in this feature, an access code is on this after visit summary for your convenience. Thank you for allowing [place] to be involved in your care.  It was great to see you today!  I hope you have a great day!!

## 2019-01-09 NOTE — Assessment & Plan Note (Signed)
Referred by primary care for hepatitis C.  Positive hepatitis C antibody and RNA verification.  He is missing several elements of his required work-up prior to discussion of treatment options (see checklist below).  We will further work-up with serologies and liver ultrasound elastography.  Return for follow-up in 2 months to discuss treatment options based on results.  Hepatitis C Treatment Checklist  HCV RNA: Yes HCV GT: No - ordered Elastography: No - ordered Tx history: Not Treated Previously Hepatitis A serologies: No - ordered Hepatitis B serologies: No - ordered HIV Serologies: No - ordered Contraindicated Medications: Verified - None Contraception: N/A

## 2019-01-09 NOTE — Assessment & Plan Note (Signed)
Patient notes rectal bleeding has been ongoing since childhood.  Does not occur very often.  At most he will have rectal bleeding once every several months.  He has never had a colonoscopy before and is currently 53 years old.  At this point given his rectal bleeding and his age we will plan for a colonoscopy.  Proceed with colonoscopy on propofol/MAC with Dr. Oneida Alar in the near future. The risks, benefits, and alternatives have been discussed in detail with the patient. They state understanding and desire to proceed.   The patient is not on any anticoagulants, anxiolytics, chronic pain medications, or antidepressants.  He does have a history of alcoholism and drug use.  Given his history we will plan for the procedure on propofol/MAC to promote adequate sedation.

## 2019-01-09 NOTE — Telephone Encounter (Signed)
Called patient and is aware u/s scheduled for 6/4 at 9:30am, arrival time 9:15am, npo after midnight. TCS scheduled for 7/6 at 2:15pm. Patient aware will mail instructions for both. Confirmed mailing address. He also stated he currently does not have insurance. I advised if he gets insurance prior to procedure then to call us and let us know the info. He voiced understanding. Will also mail pre-op letter with instructions.

## 2019-01-30 ENCOUNTER — Ambulatory Visit: Payer: Self-pay | Admitting: Physician Assistant

## 2019-02-01 ENCOUNTER — Ambulatory Visit: Payer: Self-pay | Admitting: Physician Assistant

## 2019-02-01 ENCOUNTER — Encounter: Payer: Self-pay | Admitting: Physician Assistant

## 2019-02-01 DIAGNOSIS — R7989 Other specified abnormal findings of blood chemistry: Secondary | ICD-10-CM

## 2019-02-01 DIAGNOSIS — B1920 Unspecified viral hepatitis C without hepatic coma: Secondary | ICD-10-CM

## 2019-02-01 DIAGNOSIS — E785 Hyperlipidemia, unspecified: Secondary | ICD-10-CM

## 2019-02-01 DIAGNOSIS — F17200 Nicotine dependence, unspecified, uncomplicated: Secondary | ICD-10-CM

## 2019-02-01 DIAGNOSIS — L989 Disorder of the skin and subcutaneous tissue, unspecified: Secondary | ICD-10-CM

## 2019-02-01 NOTE — Progress Notes (Signed)
   There were no vitals taken for this visit.   Subjective:    Patient ID: Drew Kent, male    DOB: 1965/09/15, 53 y.o.   MRN: [id number]  HPI: Drew Kent is a 53 y.o. male presenting on 02/01/2019 for No chief complaint on file.   HPI  This is a telemedicine visit due to coronavirus pandemic.  It is  Via Telephone as pt does not have a smartphone with video capabilities  I connected with  Drew Kent on 02/01/19 by a video enabled telemedicine application and verified that I am speaking with the correct person using two identifiers.   I discussed the limitations of evaluation and management by telemedicine. The patient expressed understanding and agreed to proceed.   Pt is at home.  Provider is at office/clinic  Pt had appt with GI for hep c and rectal bleeding.   Pt did not yet turn in application for [facility] financial  Assistance.   Pt is not following lowfat diet  Pt says he is doing well and has no complaints   Relevant past medical, surgical, family and social history reviewed and updated as indicated. Interim medical history since our last visit reviewed. Allergies and medications reviewed and updated.   Current Outpatient Medications:  .  triamcinolone (KENALOG) 0.025 % ointment, Apply 1 application topically 2 (two) times daily., Disp: 454 g, Rfl: 0   Review of Systems  Per HPI unless specifically indicated above     Objective:    There were no vitals taken for this visit.  Wt Readings from Last 3 Encounters:  10/30/18 297 lb (134.7 kg)  10/10/18 290 lb (131.5 kg)  09/27/18 290 lb (131.5 kg)    Physical Exam Pulmonary:     Effort: Pulmonary effort is normal. No respiratory distress.  Neurological:     Mental Status: He is alert and oriented to person, place, and time.  Psychiatric:        Attention and Perception: Attention normal.        Mood and Affect: Mood normal.        Speech: Speech normal.        Behavior: Behavior is cooperative.         Cognition and Memory: Cognition normal.     Comments: Very upbeat mood         Assessment & Plan:   Encounter Diagnoses  Name Primary?  . Hyperlipidemia, unspecified hyperlipidemia type Yes  . Tobacco use disorder   . Hepatitis C virus infection without hepatic coma, unspecified chronicity   . Elevated LFTs   . Psoriasis-like skin disease      -encouraged pt to Turn in [facility] application -pt to continue with GI per their recomendation Pt counseled to follow [facility] for lipids -pt will follow up 4 -5  Months.  He will Contact office sooner prn

## 2019-02-08 ENCOUNTER — Other Ambulatory Visit: Payer: Self-pay

## 2019-02-08 ENCOUNTER — Ambulatory Visit (HOSPITAL_COMMUNITY): Payer: Self-pay

## 2019-02-08 ENCOUNTER — Other Ambulatory Visit (HOSPITAL_COMMUNITY)
Admission: RE | Admit: 2019-02-08 | Discharge: 2019-02-08 | Disposition: A | Discharge status: Home / Self Care | Payer: Self-pay | Source: Ambulatory Visit | Attending: Nurse Practitioner | Admitting: Nurse Practitioner

## 2019-02-08 DIAGNOSIS — B1920 Unspecified viral hepatitis C without hepatic coma: Secondary | ICD-10-CM | POA: Insufficient documentation

## 2019-02-08 LAB — COMPREHENSIVE METABOLIC PANEL
ALT: 74 U/L — ABNORMAL HIGH (ref 0–44)
AST: 65 U/L — ABNORMAL HIGH (ref 15–41)
Albumin: 4.2 g/dL (ref 3.5–5.0)
Alkaline Phosphatase: 76 U/L (ref 38–126)
Anion gap: 9 (ref 5–15)
BUN: 15 mg/dL (ref 6–20)
CO2: 25 mmol/L (ref 22–32)
Calcium: 9.2 mg/dL (ref 8.9–10.3)
Chloride: 104 mmol/L (ref 98–111)
Creatinine, Ser: 0.99 mg/dL (ref 0.61–1.24)
GFR calc Af Amer: >60 mL/min (ref >60)
GFR calc non Af Amer: >60 mL/min (ref >60)
Glucose, Bld: 88 mg/dL (ref 70–99)
Potassium: 4 mmol/L (ref 3.5–5.1)
Sodium: 138 mmol/L (ref 135–145)
Total Bilirubin: 0.7 mg/dL (ref 0.3–1.2)
Total Protein: 7.6 g/dL (ref 6.5–8.1)

## 2019-02-08 LAB — CBC WITH DIFFERENTIAL/PLATELET
Abs Immature Granulocytes: 0.05 10*3/uL (ref 0.00–0.07)
Basophils Absolute: 0.1 10*3/uL (ref 0.0–0.1)
Basophils Relative: 1 %
Eosinophils Absolute: 0.1 10*3/uL (ref 0.0–0.5)
Eosinophils Relative: 2 %
HCT: 47.5 % (ref 39.0–52.0)
Hemoglobin: 15.6 g/dL (ref 13.0–17.0)
Immature Granulocytes: 1 %
Lymphocytes Relative: 28 %
Lymphs Abs: 1.8 10*3/uL (ref 0.7–4.0)
MCH: 31.3 pg (ref 26.0–34.0)
MCHC: 32.8 g/dL (ref 30.0–36.0)
MCV: 95.2 fL (ref 80.0–100.0)
Monocytes Absolute: 0.6 10*3/uL (ref 0.1–1.0)
Monocytes Relative: 10 %
Neutro Abs: 3.9 10*3/uL (ref 1.7–7.7)
Neutrophils Relative %: 58 %
Platelets: 191 10*3/uL (ref 150–400)
RBC: 4.99 MIL/uL (ref 4.22–5.81)
RDW: 12.3 % (ref 11.5–15.5)
WBC: 6.6 10*3/uL (ref 4.0–10.5)
nRBC: 0 % (ref 0.0–0.2)

## 2019-02-09 LAB — HEPATITIS B CORE ANTIBODY, IGM: Hep B C IgM: POSITIVE — AB

## 2019-02-09 LAB — HEPATITIS A ANTIBODY, TOTAL: hep A Total Ab: POSITIVE — AB

## 2019-02-09 LAB — HEPATITIS B SURFACE ANTIGEN: Hepatitis B Surface Ag: NEGATIVE

## 2019-02-09 LAB — HEPATITIS B SURFACE ANTIBODY, QUANTITATIVE: Hep B S AB Quant (Post): 353.3 m[IU]/mL (ref >9.9)

## 2019-02-09 LAB — HIV ANTIBODY (ROUTINE TESTING W REFLEX): HIV Screen 4th Generation wRfx: NONREACTIVE

## 2019-02-11 LAB — HEPATITIS C GENOTYPE

## 2019-02-14 ENCOUNTER — Ambulatory Visit (HOSPITAL_COMMUNITY)
Admission: RE | Admit: 2019-02-14 | Discharge: 2019-02-14 | Disposition: A | Discharge status: Home / Self Care | Payer: Self-pay | Source: Ambulatory Visit | Attending: Nurse Practitioner | Admitting: Nurse Practitioner

## 2019-02-14 ENCOUNTER — Other Ambulatory Visit: Payer: Self-pay

## 2019-02-14 DIAGNOSIS — B1920 Unspecified viral hepatitis C without hepatic coma: Secondary | ICD-10-CM | POA: Insufficient documentation

## 2019-02-14 DIAGNOSIS — K625 Hemorrhage of anus and rectum: Secondary | ICD-10-CM | POA: Insufficient documentation

## 2019-02-15 ENCOUNTER — Ambulatory Visit (HOSPITAL_COMMUNITY): Admission: RE | Admit: 2019-02-15 | Payer: Self-pay | Source: Ambulatory Visit

## 2019-03-06 ENCOUNTER — Encounter (HOSPITAL_COMMUNITY): Payer: Self-pay

## 2019-03-06 ENCOUNTER — Other Ambulatory Visit: Payer: Self-pay

## 2019-03-07 ENCOUNTER — Encounter (HOSPITAL_COMMUNITY)
Admission: RE | Admit: 2019-03-07 | Discharge: 2019-03-07 | Disposition: A | Discharge status: Home / Self Care | Payer: Self-pay | Source: Ambulatory Visit | Attending: Gastroenterology | Admitting: Gastroenterology

## 2019-03-07 ENCOUNTER — Telehealth: Payer: Self-pay

## 2019-03-07 ENCOUNTER — Telehealth: Payer: Self-pay | Admitting: Gastroenterology

## 2019-03-07 MED ORDER — PEG 3350-KCL-NA BICARB-NACL 420 G PO SOLR: 4000.0000 mL | Freq: Once | ORAL | 0 refills | Status: AC

## 2019-03-07 NOTE — Telephone Encounter (Signed)
Pt called back, questions answered.

## 2019-03-07 NOTE — Addendum Note (Signed)
Addended by: Inge Rise on: 03/07/2019 11:21 AM   Modules accepted: Orders

## 2019-03-07 NOTE — Telephone Encounter (Signed)
Rx was sent 5/5 to [facility]. rx resent

## 2019-03-07 NOTE — Telephone Encounter (Signed)
Patient called and said that [facility] in [place] did not have his prescription and he was told by [place] to go this morning and pick it up. Please resend

## 2019-03-07 NOTE — Telephone Encounter (Signed)
Pt called to ask with questions about his pre-op etc and Tretha Sciara helped him with that.  He was also requesting results from his labs and imaging from 02/08/2019 and 02/14/2019.  Forwarding to Walden Field, NP who is out of the office today, but will discuss with him on Thursday when he returns and let the patient know.

## 2019-03-07 NOTE — Telephone Encounter (Signed)
Pt called office and LMOVM. He has questions about prep. Tried to call pt back. Call went straight to VM, LMOVM for him to call office.

## 2019-03-08 ENCOUNTER — Other Ambulatory Visit (HOSPITAL_COMMUNITY)
Admission: RE | Admit: 2019-03-08 | Discharge: 2019-03-08 | Disposition: A | Discharge status: Home / Self Care | Payer: Self-pay | Source: Ambulatory Visit | Attending: Gastroenterology | Admitting: Gastroenterology

## 2019-03-08 DIAGNOSIS — Z01812 Encounter for preprocedural laboratory examination: Secondary | ICD-10-CM | POA: Insufficient documentation

## 2019-03-08 DIAGNOSIS — Z1159 Encounter for screening for other viral diseases: Secondary | ICD-10-CM | POA: Insufficient documentation

## 2019-03-08 LAB — SARS CORONAVIRUS 2 (TAT 6-24 HRS): SARS Coronavirus 2: NEGATIVE

## 2019-03-08 NOTE — Progress Notes (Signed)
Pt is aware.  

## 2019-03-08 NOTE — Progress Notes (Signed)
PT is aware.

## 2019-03-12 ENCOUNTER — Encounter (HOSPITAL_COMMUNITY)
Admission: RE | Disposition: A | Discharge status: Home / Self Care | Payer: Self-pay | Source: Home / Self Care | Attending: Gastroenterology

## 2019-03-12 ENCOUNTER — Other Ambulatory Visit: Payer: Self-pay

## 2019-03-12 ENCOUNTER — Ambulatory Visit (HOSPITAL_COMMUNITY)
Admission: RE | Admit: 2019-03-12 | Discharge: 2019-03-12 | Disposition: A | Discharge status: Home / Self Care | Payer: BC Managed Care – PPO | Attending: Gastroenterology | Admitting: Gastroenterology

## 2019-03-12 ENCOUNTER — Ambulatory Visit (HOSPITAL_COMMUNITY): Payer: BC Managed Care – PPO | Admitting: Anesthesiology

## 2019-03-12 ENCOUNTER — Encounter (HOSPITAL_COMMUNITY): Payer: Self-pay | Admitting: *Deleted

## 2019-03-12 DIAGNOSIS — K921 Melena: Secondary | ICD-10-CM | POA: Insufficient documentation

## 2019-03-12 DIAGNOSIS — F17210 Nicotine dependence, cigarettes, uncomplicated: Secondary | ICD-10-CM | POA: Insufficient documentation

## 2019-03-12 DIAGNOSIS — Z6841 Body Mass Index (BMI) 40.0 and over, adult: Secondary | ICD-10-CM | POA: Diagnosis not present

## 2019-03-12 DIAGNOSIS — Z791 Long term (current) use of non-steroidal anti-inflammatories (NSAID): Secondary | ICD-10-CM | POA: Diagnosis not present

## 2019-03-12 DIAGNOSIS — K625 Hemorrhage of anus and rectum: Secondary | ICD-10-CM

## 2019-03-12 DIAGNOSIS — D123 Benign neoplasm of transverse colon: Secondary | ICD-10-CM | POA: Diagnosis not present

## 2019-03-12 DIAGNOSIS — K644 Residual hemorrhoidal skin tags: Secondary | ICD-10-CM | POA: Insufficient documentation

## 2019-03-12 DIAGNOSIS — K5730 Diverticulosis of large intestine without perforation or abscess without bleeding: Secondary | ICD-10-CM | POA: Insufficient documentation

## 2019-03-12 DIAGNOSIS — B182 Chronic viral hepatitis C: Secondary | ICD-10-CM | POA: Diagnosis not present

## 2019-03-12 DIAGNOSIS — K648 Other hemorrhoids: Secondary | ICD-10-CM | POA: Diagnosis not present

## 2019-03-12 DIAGNOSIS — K635 Polyp of colon: Secondary | ICD-10-CM

## 2019-03-12 DIAGNOSIS — M1990 Unspecified osteoarthritis, unspecified site: Secondary | ICD-10-CM | POA: Diagnosis not present

## 2019-03-12 HISTORY — PX: POLYPECTOMY: SHX5525

## 2019-03-12 HISTORY — PX: COLONOSCOPY WITH PROPOFOL: SHX5780

## 2019-03-12 SURGERY — COLONOSCOPY WITH PROPOFOL
Anesthesia: General

## 2019-03-12 MED ORDER — HYDROMORPHONE HCL 1 MG/ML IJ SOLN: 0.2500 mg | INTRAMUSCULAR | Status: DC | PRN

## 2019-03-12 MED ORDER — HYDROCODONE-ACETAMINOPHEN 7.5-325 MG PO TABS: 1.0000 | ORAL_TABLET | Freq: Once | ORAL | Status: DC | PRN

## 2019-03-12 MED ORDER — LACTATED RINGERS IV SOLN
INTRAVENOUS | Status: DC
  Administered 2019-03-12: 12:00:00 via INTRAVENOUS

## 2019-03-12 MED ORDER — MIDAZOLAM HCL 2 MG/2ML IJ SOLN: 0.5000 mg | Freq: Once | INTRAMUSCULAR | Status: DC | PRN

## 2019-03-12 MED ORDER — PROPOFOL 10 MG/ML IV BOLUS
INTRAVENOUS | Status: AC
  Filled 2019-03-12: qty 20

## 2019-03-12 MED ORDER — PROMETHAZINE HCL 25 MG/ML IJ SOLN: 6.2500 mg | INTRAMUSCULAR | Status: DC | PRN

## 2019-03-12 MED ORDER — CHLORHEXIDINE GLUCONATE CLOTH 2 % EX PADS: 6.0000 | MEDICATED_PAD | Freq: Once | CUTANEOUS | Status: DC

## 2019-03-12 MED ORDER — PROPOFOL 500 MG/50ML IV EMUL
INTRAVENOUS | Status: DC | PRN
  Administered 2019-03-12: 13:00:00 via INTRAVENOUS
  Administered 2019-03-12: 150 ug/kg/min via INTRAVENOUS
  Administered 2019-03-12: 13:00:00 via INTRAVENOUS

## 2019-03-12 MED ORDER — MIDAZOLAM HCL 2 MG/2ML IJ SOLN
INTRAMUSCULAR | Status: AC
  Filled 2019-03-12: qty 2

## 2019-03-12 MED ORDER — MIDAZOLAM HCL 5 MG/5ML IJ SOLN
INTRAMUSCULAR | Status: DC | PRN
  Administered 2019-03-12: 2 mg via INTRAVENOUS

## 2019-03-12 NOTE — Op Note (Signed)
[facility] Patient Name: Drew Kent Procedure Date: 03/12/2019 12:30 PM MRN: [id number] Date of Birth: 07/10/1966 Attending MD: Barney Drain MD, MD CSN: [id number] Age: 53 Admit Type: Outpatient Procedure:                Colonoscopy with COLD SNARE POLYPECTOMY Indications:              Hematochezia Providers:                Barney Drain MD, MD, Otis Peak B. Sharon Seller, RN, Aram Candela Referring MD:             Soyla Dryer PA-C, PA Medicines:                Propofol per Anesthesia Complications:            No immediate complications. Estimated Blood Loss:     Estimated blood loss was minimal. Procedure:                Pre-Anesthesia Assessment:                           - Prior to the procedure, a History and Physical                            was performed, and patient medications and                            allergies were reviewed. The patient's tolerance of                            previous anesthesia was also reviewed. The risks                            and benefits of the procedure and the sedation                            options and risks were discussed with the patient.                            All questions were answered, and informed consent                            was obtained. Prior Anticoagulants: The patient has                            taken no previous anticoagulant or antiplatelet                            agents except for NSAID medication. ASA Grade                            Assessment: II - A patient with mild systemic  disease. After reviewing the risks and benefits,                            the patient was deemed in satisfactory condition to                            undergo the procedure. After obtaining informed                            consent, the colonoscope was passed under direct                            vision. Throughout the procedure, the patient's        blood pressure, pulse, and oxygen saturations were                            monitored continuously. The CF-[id number] ([id number])                            scope was introduced through the anus and advanced                            to the 3 cm into the ileum. The colonoscopy was                            somewhat difficult due to a tortuous colon.                            Successful completion of the procedure was aided by                            straightening and shortening the scope to obtain                            bowel loop reduction and COLOWRAP. The patient                            tolerated the procedure well. The quality of the                            bowel preparation was good. The ileocecal valve,                            appendiceal orifice, and rectum were photographed. Scope In: 1:16:47 PM Scope Out: 1:36:53 PM Scope Withdrawal Time: 0 hours 17 minutes 49 seconds  Total Procedure Duration: 0 hours 20 minutes 6 seconds  Findings:      Two sessile polyps were found in the splenic flexure and mid transverse       colon. The polyps were 3 to 5 mm in size. These polyps were removed with       a cold snare. Resection and retrieval were complete.      Multiple small and large-mouthed diverticula were found in the  recto-sigmoid colon and sigmoid colon.      External and internal hemorrhoids were found. The hemorrhoids were       moderate.      The recto-sigmoid colon and sigmoid colon were mildly tortuous. Impression:               - Two 3 to 5 mm polyps at the splenic flexure and                            in the mid transverse colon, removed with a cold                            snare. Resected and retrieved.                           - Diverticulosis in the recto-sigmoid colon and in                            the sigmoid colon.                           - External and internal hemorrhoids.                           - Tortuous LEFT colon. Moderate  Sedation:      Per Anesthesia Care Recommendation:           - Patient has a contact number available for                            emergencies. The signs and symptoms of potential                            delayed complications were discussed with the                            patient. Return to normal activities tomorrow.                            Written discharge instructions were provided to the                            patient.                           - High fiber diet.                           - Continue present medications.                           - Await pathology results.                           - Repeat colonoscopy in 5-10 years for surveillance.                           -  Return to GI office in 4 months. Procedure Code(s):        --- Professional ---                           [phone], Colonoscopy, flexible; with removal of                            tumor(s), polyp(s), or other lesion(s) by snare                            technique Diagnosis Code(s):        --- Professional ---                           K63.5, Polyp of colon                           K64.8, Other hemorrhoids                           K92.1, Melena (includes Hematochezia)                           K57.30, Diverticulosis of large intestine without                            perforation or abscess without bleeding                           Q43.8, Other specified congenital malformations of                            intestine CPT copyright 2019 American Medical Association. All rights reserved. The codes documented in this report are preliminary and upon coder review may  be revised to meet current compliance requirements. Barney Drain, MD Barney Drain MD, MD 03/12/2019 1:55:41 PM This report has been signed electronically. Number of Addenda: 0

## 2019-03-12 NOTE — Anesthesia Preprocedure Evaluation (Signed)
Anesthesia Evaluation  Patient identified by MRN, date of birth, ID band Patient awake    Reviewed: Allergy & Precautions, NPO status , Patient's Chart, lab work & pertinent test results  Airway Mallampati: II  TM Distance: >3 FB Neck ROM: Full    Dental no notable dental hx. 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 negative neurological ROS     GI/Hepatic negative GI ROS, (+) Hepatitis -, B, A, CReported IVDA remotely  Denies current abuse of any drugs   Endo/Other  Morbid obesity  Renal/GU negative Renal ROS  negative genitourinary   Musculoskeletal  (+) Arthritis , Osteoarthritis,    Abdominal   Peds negative pediatric ROS (+)  Hematology negative hematology ROS (+)   Anesthesia Other Findings   Reproductive/Obstetrics negative OB ROS                             Anesthesia Physical Anesthesia Plan  ASA: III  Anesthesia Plan: General   Post-op Pain Management:    Induction: Intravenous  PONV Risk Score and Plan: 1 and TIVA, Propofol infusion and Treatment may vary due to age or medical condition  Airway Management Planned: Nasal Cannula and Simple Face Mask  Additional Equipment:   Intra-op Plan:   Post-operative Plan:   Informed Consent: I have reviewed the patients History and Physical, chart, labs and discussed the procedure including the risks, benefits and alternatives for the proposed anesthesia with the patient or authorized representative who has indicated his/her understanding and acceptance.     Dental advisory given  Plan Discussed with: CRNA  Anesthesia Plan Comments:  (Plan Full PPE use  Plan GA with GETA as needed -d/w pt -WTP with same after Q&A)        Anesthesia Quick Evaluation

## 2019-03-12 NOTE — Anesthesia Postprocedure Evaluation (Signed)
Anesthesia Post Note  Patient: Drew Kent  Procedure(s) Performed: COLONOSCOPY WITH PROPOFOL (N/A ) POLYPECTOMY  Patient location during evaluation: PACU Anesthesia Type: General Level of consciousness: awake and alert Pain management: pain level controlled Vital Signs Assessment: post-procedure vital signs reviewed and stable Respiratory status: spontaneous breathing Cardiovascular status: stable Postop Assessment: no apparent nausea or vomiting Anesthetic complications: no     Last Vitals:  Vitals:   03/12/19 1144 03/12/19 1347  BP: (!) 148/98 126/84  Pulse: 94 92  Resp: 15 17  Temp: 36.8 C 36.5 C  SpO2: 98% 96%    Last Pain:  Vitals:   03/12/19 1305  TempSrc:   PainSc: 0-No pain                 ADAMS, AMY A

## 2019-03-12 NOTE — Telephone Encounter (Signed)
See lab and imaging results. Pt was informed on Thursday 03/08/2019.

## 2019-03-12 NOTE — Transfer of Care (Signed)
Immediate Anesthesia Transfer of Care Note  Patient: Drew Kent  Procedure(s) Performed: COLONOSCOPY WITH PROPOFOL (N/A ) POLYPECTOMY  Patient Location: PACU  Anesthesia Type:General  Level of Consciousness: awake, alert , oriented and patient cooperative  Airway & Oxygen Therapy: Patient Spontanous Breathing  Post-op Assessment: Report given to RN and Post -op Vital signs reviewed and stable  Post vital signs: Reviewed and stable  Last Vitals:  Vitals Value Taken Time  BP 126/84 03/12/19 1347  Temp 36.5 C 03/12/19 1347  Pulse 91 03/12/19 1350  Resp 20 03/12/19 1350  SpO2 95 % 03/12/19 1350  Vitals shown include unvalidated device data.  Last Pain:  Vitals:   03/12/19 1305  TempSrc:   PainSc: 0-No pain         Complications: No apparent anesthesia complications

## 2019-03-12 NOTE — Discharge Instructions (Signed)
You have small internal hemorrhoids and diverticulosis IN YOUR LEFT AND RIGHT COLON. YOU HAD TWO POLYPS REMOVED.    DRINK WATER TO KEEP YOUR URINE LIGHT YELLOW.  CONTINUE YOUR WEIGHT LOSS EFFORTS. YOUR BODY MASS INDEX IS OVER 30 WHICH MEANS YOU ARE OBESE. OBESITY IS ASSOCIATED WITH AN INCREASE FOR ALL CANCERS, INCLUDING ESOPHAGEAL AND COLON CANCER.  FOLLOW A HIGH FIBER DIET. AVOID ITEMS THAT CAUSE BLOATING. See info below.   USE PREPARATION H FOUR TIMES  A DAY IF NEEDED TO RELIEVE RECTAL PAIN/PRESSURE/BLEEDING.   YOUR BIOPSY RESULTS WILL BE BACK IN 5 BUSINESS DAYS.  FOLLOW UP IN 4 MOS.   Next colonoscopy in 5-10 years.  Colonoscopy Care After Read the instructions outlined below and refer to this sheet in the next week. These discharge instructions provide you with general information on caring for yourself after you leave the hospital. While your treatment has been planned according to the most current medical practices available, unavoidable complications occasionally occur. If you have any problems or questions after discharge, call DR. FIELDS, [phone].  ACTIVITY  You may resume your regular activity, but move at a slower pace for the next 24 hours.   Take frequent rest periods for the next 24 hours.   Walking will help get rid of the air and reduce the bloated feeling in your belly (abdomen).   No driving for 24 hours (because of the medicine (anesthesia) used during the test).   You may shower.   Do not sign any important legal documents or operate any machinery for 24 hours (because of the anesthesia used during the test).    NUTRITION  Drink plenty of fluids.   You may resume your normal diet as instructed by your doctor.   Begin with a light meal and progress to your normal diet. Heavy or fried foods are harder to digest and may make you feel sick to your stomach (nauseated).   Avoid alcoholic beverages for 24 hours or as instructed.    MEDICATIONS  You  may resume your normal medications.   WHAT YOU CAN EXPECT TODAY  Some feelings of bloating in the abdomen.   Passage of more gas than usual.   Spotting of blood in your stool or on the toilet paper  .  IF YOU HAD POLYPS REMOVED DURING THE COLONOSCOPY:  Eat a soft diet IF YOU HAVE NAUSEA, BLOATING, ABDOMINAL PAIN, OR VOMITING.    FINDING OUT THE RESULTS OF YOUR TEST Not all test results are available during your visit. DR. Oneida Alar WILL CALL YOU WITHIN 14 DAYS OF YOUR PROCEDUE WITH YOUR RESULTS. Do not assume everything is normal if you have not heard from DR. FIELDS, CALL HER OFFICE AT [phone].  SEEK IMMEDIATE MEDICAL ATTENTION AND CALL THE OFFICE: [phone] IF:  You have more than a spotting of blood in your stool.   Your belly is swollen (abdominal distention).   You are nauseated or vomiting.   You have a temperature over 101F.   You have abdominal pain or discomfort that is severe or gets worse throughout the day.  High-Fiber Diet A high-fiber diet changes your normal diet to include more whole grains, legumes, fruits, and vegetables. Changes in the diet involve replacing refined carbohydrates with unrefined foods. The calorie level of the diet is essentially unchanged. The Dietary Reference Intake (recommended amount) for adult males is 38 grams per day. For adult females, it is 25 grams per day. Pregnant and lactating women should consume 28 grams of  fiber per day. Fiber is the intact part of a plant that is not broken down during digestion. Functional fiber is fiber that has been isolated from the plant to provide a beneficial effect in the body.  PURPOSE  Increase stool bulk.   Ease and regulate bowel movements.   Lower cholesterol.   REDUCE RISK OF COLON CANCER  INDICATIONS THAT YOU NEED MORE FIBER  Constipation and hemorrhoids.   Uncomplicated diverticulosis (intestine condition) and irritable bowel syndrome.   Weight management.   As a protective  measure against hardening of the arteries (atherosclerosis), diabetes, and cancer.   GUIDELINES FOR INCREASING FIBER IN THE DIET  Start adding fiber to the diet slowly. A gradual increase of about 5 more grams (2 slices of whole-wheat bread, 2 servings of most fruits or vegetables, or 1 bowl of high-fiber cereal) per day is best. Too rapid an increase in fiber may result in constipation, flatulence, and bloating.   Drink enough water and fluids to keep your urine clear or pale yellow. Water, juice, or caffeine-free drinks are recommended. Not drinking enough fluid may cause constipation.   Eat a variety of high-fiber foods rather than one type of fiber.   Try to increase your intake of fiber through using high-fiber foods rather than fiber pills or supplements that contain small amounts of fiber.   The goal is to change the types of food eaten. Do not supplement your present diet with high-fiber foods, but replace foods in your present diet.   INCLUDE A VARIETY OF FIBER SOURCES  Replace refined and processed grains with whole grains, canned fruits with fresh fruits, and incorporate other fiber sources. White rice, white breads, and most bakery goods contain little or no fiber.   Brown whole-grain rice, buckwheat oats, and many fruits and vegetables are all good sources of fiber. These include: broccoli, Brussels sprouts, cabbage, cauliflower, beets, sweet potatoes, white potatoes (skin on), carrots, tomatoes, eggplant, squash, berries, fresh fruits, and dried fruits.   Cereals appear to be the richest source of fiber. Cereal fiber is found in whole grains and bran. Bran is the fiber-rich outer coat of cereal grain, which is largely removed in refining. In whole-grain cereals, the bran remains. In breakfast cereals, the largest amount of fiber is found in those with "bran" in their names. The fiber content is sometimes indicated on the label.   You may need to include additional fruits and  vegetables each day.   In baking, for 1 cup white flour, you may use the following substitutions:   1 cup whole-wheat flour minus 2 tablespoons.   1/2 cup white flour plus 1/2 cup whole-wheat flour.   Polyps, Colon  A polyp is extra tissue that grows inside your body. Colon polyps grow in the large intestine. The large intestine, also called the colon, is part of your digestive system. It is a long, hollow tube at the end of your digestive tract where your body makes and stores stool. Most polyps are not dangerous. They are benign. This means they are not cancerous. But over time, some types of polyps can turn into cancer. Polyps that are smaller than a pea are usually not harmful. But larger polyps could someday become or may already be cancerous. To be safe, doctors remove all polyps and test them.   PREVENTION There is not one sure way to prevent polyps. You might be able to lower your risk of getting them if you:  Eat more fruits and vegetables and  less fatty food.   Do not smoke.   Avoid alcohol.   Exercise every day.   Lose weight if you are overweight.   Eating more calcium and folate can also lower your risk of getting polyps. Some foods that are rich in calcium are milk, cheese, and broccoli. Some foods that are rich in folate are chickpeas, kidney beans, and spinach.    Diverticulosis Diverticulosis is a common condition that develops when small pouches (diverticula) form in the wall of the colon. The risk of diverticulosis increases with age. It happens more often in people who eat a low-fiber diet. Most individuals with diverticulosis have no symptoms. Those individuals with symptoms usually experience belly (abdominal) pain, constipation, or loose stools (diarrhea).  HOME CARE INSTRUCTIONS  Increase the amount of fiber in your diet as directed by your caregiver or dietician. This may reduce symptoms of diverticulosis.   Drink at least 6 to 8 glasses of water each day to  prevent constipation.   Try not to strain when you have a bowel movement.   Avoiding nuts and seeds to prevent complications is NOT NECESSARY.   FOODS HAVING HIGH FIBER CONTENT INCLUDE:  Fruits. Apple, peach, pear, tangerine, raisins, prunes.   Vegetables. Brussels sprouts, asparagus, broccoli, cabbage, carrot, cauliflower, romaine lettuce, spinach, summer squash, tomato, winter squash, zucchini.   Starchy Vegetables. Baked beans, kidney beans, lima beans, split peas, lentils, potatoes (with skin).   Grains. Whole wheat bread, brown rice, bran flake cereal, plain oatmeal, white rice, shredded wheat, bran muffins.   SEEK IMMEDIATE MEDICAL CARE IF:  You develop increasing pain or severe bloating.   You have an oral temperature above 101F.   You develop vomiting or bowel movements that are bloody or black.         Monitored Anesthesia Care, Care After These instructions provide you with information about caring for yourself after your procedure. Your health care provider may also give you more specific instructions. Your treatment has been planned according to current medical practices, but problems sometimes occur. Call your health care provider if you have any problems or questions after your procedure. What can I expect after the procedure? After your procedure, you may:  Feel sleepy for several hours.  Feel clumsy and have poor balance for several hours.  Feel forgetful about what happened after the procedure.  Have poor judgment for several hours.  Feel nauseous or vomit.  Have a sore throat if you had a breathing tube during the procedure. Follow these instructions at home: For at least 24 hours after the procedure:      Have a responsible adult stay with you. It is important to have someone help care for you until you are awake and alert.  Rest as needed.  Do not: ? Participate in activities in which you could fall or become injured. ? Drive. ? Use heavy  machinery. ? Drink alcohol. ? Take sleeping pills or medicines that cause drowsiness. ? Make important decisions or sign legal documents. ? Take care of children on your own. Eating and drinking  Follow the diet that is recommended by your health care provider.  If you vomit, drink water, juice, or soup when you can drink without vomiting.  Make sure you have little or no nausea before eating solid foods. General instructions  Take over-the-counter and prescription medicines only as told by your health care provider.  If you have sleep apnea, surgery and certain medicines can increase your risk for breathing problems.  Follow instructions from your health care provider about wearing your sleep device: ? Anytime you are sleeping, including during daytime naps. ? While taking prescription pain medicines, sleeping medicines, or medicines that make you drowsy.  If you smoke, do not smoke without supervision.  Keep all follow-up visits as told by your health care provider. This is important. Contact a health care provider if:  You keep feeling nauseous or you keep vomiting.  You feel light-headed.  You develop a rash.  You have a fever. Get help right away if:  You have trouble breathing. Summary  For several hours after your procedure, you may feel sleepy and have poor judgment.  Have a responsible adult stay with you for at least 24 hours or until you are awake and alert. This information is not intended to replace advice given to you by your health care provider. Make sure you discuss any questions you have with your health care provider. Document Released: 12/14/2015 Document Revised: 11/21/2017 Document Reviewed: 12/14/2015 Elsevier Patient Education  2020 [employer].

## 2019-03-12 NOTE — H&P (Signed)
Primary Care Physician:  Soyla Dryer, PA-C Primary Gastroenterologist:  Dr. Oneida Alar  Pre-Procedure History & Physical: HPI:  Drew Kent is a 53 y.o. male here for BRBPR.  Past Medical History:  Diagnosis Date  . Arthritis   . Chronic hepatitis C ([place])     Past Surgical History:  Procedure Laterality Date  . HERNIA REPAIR    . WRIST ARTHROSCOPY      Prior to Admission medications   Medication Sig Start Date End Date Taking? Authorizing Provider  ibuprofen (ADVIL) 200 MG tablet Take 600 mg by mouth daily as needed for headache or moderate pain.   Yes [provider]  loratadine (CLARITIN) 10 MG tablet Take 10 mg by mouth daily.   Yes [provider]  triamcinolone cream (KENALOG) 0.1 % Apply 1 application topically 5 (five) times daily as needed (rash).   Yes [provider]    Allergies as of 01/09/2019  . (No Known Allergies)    Family History  Problem Relation Age of Onset  . Heart failure Mother   . Heart disease Mother   . Cancer Other   . Diabetes Other   . Heart failure Other   . Colon cancer Neg Hx   . Colon polyps Neg Hx     Social History   Socioeconomic History  . Marital status: Married    Spouse name: Not on file  . Number of children: Not on file  . Years of education: Not on file  . Highest education level: Not on file  Occupational History  . Not on file  Social Needs  . Financial resource strain: Not on file  . Food insecurity    Worry: Not on file    Inability: Not on file  . Transportation needs    Medical: Not on file    Non-medical: Not on file  Tobacco Use  . Smoking status: Current Every Day Smoker    Packs/day: 0.25    Types: Cigarettes, E-cigarettes  . Smokeless tobacco: Never Used  Substance and Sexual Activity  . Alcohol use: Not Currently    Comment: history of alcoholism (last ETOH 08/17/2018)  . Drug use: Not Currently    Types: Cocaine  . Sexual activity: Not on file  Lifestyle  .  Physical activity    Days per week: Not on file    Minutes per session: Not on file  . Stress: Not on file  Relationships  . Social Herbalist on phone: Not on file    Gets together: Not on file    Attends religious service: Not on file    Active member of club or organization: Not on file    Attends meetings of clubs or organizations: Not on file    Relationship status: Not on file  . Intimate partner violence    Fear of current or ex partner: Not on file    Emotionally abused: Not on file    Physically abused: Not on file    Forced sexual activity: Not on file  Other Topics Concern  . Not on file  Social History Narrative  . Not on file    Review of Systems: See HPI, otherwise negative ROS   Physical Exam: BP (!) 148/98   Pulse 94   Temp 98.2 F (36.8 C) (Oral)   Resp 15   Ht 6\' 2"  (1.88 m)   Wt (!) 141.5 kg   SpO2 98%   BMI 40.06 kg/m  General:  Alert,  pleasant and cooperative in NAD Head:  Normocephalic and atraumatic. Neck:  Supple; Lungs:  Clear throughout to auscultation.    Heart:  Regular rate and rhythm. Abdomen:  Soft, nontender and nondistended. Normal bowel sounds, without guarding, and without rebound.   Neurologic:  Alert and  oriented x4;  grossly normal neurologically.  Impression/Plan:    BRBPR  PLAN: TCS TODAY. DISCUSSED PROCEDURE, BENEFITS, & RISKS: < 1% chance of medication reaction, bleeding, perforation, ASPIRATION, or rupture of spleen/liver requiring surgery to fix it and missed polyps < 1 cm 10-20% of the time.

## 2019-03-16 ENCOUNTER — Encounter (HOSPITAL_COMMUNITY): Payer: Self-pay | Admitting: Gastroenterology

## 2019-03-16 ENCOUNTER — Telehealth: Payer: Self-pay | Admitting: Gastroenterology

## 2019-03-16 NOTE — Telephone Encounter (Signed)
  Please call pt. HE HAS TWO Simple adenomas removed.   DRINK WATER TO KEEP YOUR URINE LIGHT YELLOW.  CONTINUE YOUR WEIGHT LOSS EFFORTS. YOUR BODY MASS INDEX IS OVER 30 WHICH MEANS YOU ARE OBESE. OBESITY IS ASSOCIATED WITH AN INCREASE FOR ALL CANCERS, INCLUDING ESOPHAGEAL AND COLON CANCER.  FOLLOW A HIGH FIBER DIET. AVOID ITEMS THAT CAUSE BLOATING.   USE PREPARATION H FOUR TIMES  A DAY IF NEEDED TO RELIEVE RECTAL PAIN/PRESSURE/BLEEDING.  FOLLOW UP IN 4 MOS.   Next colonoscopy BETWEEN 2025-2027.

## 2019-03-19 ENCOUNTER — Telehealth: Payer: Self-pay | Admitting: Gastroenterology

## 2019-03-19 NOTE — Telephone Encounter (Signed)
PT is aware he needs to be diligent in getting his labs checked when we order him when he starts medication. He said he is very willing and will make sure to do so. He is not sure if he still has [employer], he has applied for patient assistance at [employer].  He will find out and let me know.

## 2019-03-19 NOTE — Telephone Encounter (Signed)
Patient inquiring about when he is to start taking his hep-c medication, please call

## 2019-03-19 NOTE — Telephone Encounter (Signed)
PT is aware.

## 2019-03-19 NOTE — Telephone Encounter (Signed)
PT called back and said he was told at [facility] that he has 100 % coverage and that he will be getting something in the mail.  When he receives it he will let us know.

## 2019-03-21 ENCOUNTER — Telehealth: Payer: Self-pay | Admitting: Gastroenterology

## 2019-03-21 NOTE — Telephone Encounter (Signed)
Todd from [employer] will be faxing me info for pt assistance.

## 2019-03-21 NOTE — Telephone Encounter (Signed)
I filled out both. Would prefer Epclusa. Placed on your desk for completion

## 2019-03-21 NOTE — Telephone Encounter (Signed)
REMINDER IN Epic, OV made

## 2019-03-21 NOTE — Telephone Encounter (Signed)
I have requested info to do Patient Assistance for pt's Hep C medication.

## 2019-03-21 NOTE — Telephone Encounter (Signed)
Eric, I have received 2 sets of patient assistance forms for Hepatitis C medications.   Dina Rich is for [employer] and [employer] is for [employer] or [employer].   Please let me know which, and I will have pt come by to sign and bring proof of income.

## 2019-03-21 NOTE — Telephone Encounter (Signed)
[phone] PLEASE CALL PATIENT, HE WANTS TO KNOW WHEN HE CAN START HIS HEP-C TREATMENT

## 2019-03-22 NOTE — Telephone Encounter (Signed)
Spoke with pt. He is aware that DS is working on the pt assistance paper work for his Hep C medication. Pt will be updated once his medication is ready for shipment.

## 2019-03-23 NOTE — Telephone Encounter (Signed)
Pt is aware to bring proof of income so we can fax for patient assistance.

## 2019-03-23 NOTE — Telephone Encounter (Signed)
Pt will let me know when he can come, he needs to complete some paperwork and sign for me to fax.

## 2019-03-26 NOTE — Telephone Encounter (Signed)
Pt came by office and brought financial papers and completed paperwork for patient assistance.

## 2019-03-27 NOTE — Telephone Encounter (Signed)
Paperwork was faxed to [employer].

## 2019-03-28 NOTE — Telephone Encounter (Signed)
LATE ENTRY:  Received a phone call from [place] @ [employer] yesterday as I was leaving,  in reference to the paperwork that was faxed.  She said they have a more current form and she will fax me back the info and told me how to update so hopefully we can speed up the process.  Call back number : [phone]

## 2019-04-04 ENCOUNTER — Ambulatory Visit: Payer: Self-pay | Admitting: Nurse Practitioner

## 2019-04-05 NOTE — Telephone Encounter (Signed)
I finally received a from on 04/04/2019 with info needed.  I called today and spoke to Drew Kent.  She is faxing me an up to date form to update some info and fax back to them.

## 2019-04-11 NOTE — Telephone Encounter (Signed)
New info sheets faxed back to them on 04/09/2019 and we received letter back on 04/10/2019 pt has been prequalified for [employer].   Placing on Eric's desk to sign so I can fax back to them and we can get the medication.

## 2019-04-11 NOTE — Telephone Encounter (Signed)
Form was completed, signed by Randall Hiss and faxed.

## 2019-04-11 NOTE — Addendum Note (Signed)
Addended by: Everardo All on: 04/11/2019 02:21 PM   Modules accepted: Orders

## 2019-04-12 ENCOUNTER — Telehealth: Payer: Self-pay

## 2019-04-12 NOTE — Telephone Encounter (Signed)
Please have the patient sign the acknowledgement of current medications and agreement to not start any new medications, supplements, herbs, OTC, etc without checking with [place] first.  I have checked his current medications (as given to [place] on his appointment 01/09/19) and no interactions with Epclusa found.  Please schedule an appointment for 4 weeks after starting treatment to check in and order labs.  How to take Epclusa: 1. Do not stop taking Epclusa without first notifying us to discuss why you are wanting to stop taking Epclusa. 2. Take 1 Epclusa tablet, once a day, at the same time every day. 3. Your can take Epclusa with or without food. 4. Do not miss or skip any doses of Epclusa during treatment. 5. If you take too much Epclusa, notify us or go to the nearest emergency room.  DO NOT start any new medications, whether prescription or over-the-counter, no herbs, supplements, any other treatments before checking with [place] first.

## 2019-04-12 NOTE — Telephone Encounter (Signed)
Noted  

## 2019-04-12 NOTE — Telephone Encounter (Signed)
Randall Hiss, I just received a call from [place] at [facility] that the Raeanne Gathers will be shipped out today and will be delivered on Monday 04/16/2019.  Please leave directions and instructions.

## 2019-04-17 NOTE — Telephone Encounter (Signed)
PT is aware that we have received the medication and he will come about 2:00 pm this afternoon to pick up.  He said he has not had any fever or symptoms of Covid and not been around anyone with it.   He is aware for only one person to come.

## 2019-04-17 NOTE — Telephone Encounter (Signed)
Pt has pick up his medication and all of the instructions.  He has signed and I will have copies scanned in.   He said he will start the medication this evening at bedtime, that is when he prefers to take it.

## 2019-04-18 NOTE — Telephone Encounter (Signed)
Noted, no further recommendations. 

## 2019-05-09 ENCOUNTER — Telehealth: Payer: Self-pay

## 2019-05-09 DIAGNOSIS — B1920 Unspecified viral hepatitis C without hepatic coma: Secondary | ICD-10-CM

## 2019-05-09 NOTE — Telephone Encounter (Signed)
T/C from [place] and spoke with Terri. The next shipment of Drew Kent will be delivered on Tueday, 05/15/2019. Pt is aware and I will call him when it arrives that day. He is taking at night and he will be due that night for the next 28 day supply.

## 2019-05-15 NOTE — Telephone Encounter (Signed)
FYI to EG 

## 2019-05-15 NOTE — Telephone Encounter (Signed)
PT's wife, Sharee Pimple, picked up the medication and signed the delivery ticket.  I asked her has patient added any new medications and she said he took an ASA. I asked if it was just once, and she said she would have patient call me. I told her to remind him he is to CALL us BEFORE HE TAKES ANYTHING PRESCRIBED OR OTC.

## 2019-05-15 NOTE — Telephone Encounter (Signed)
Medication was just delivered and I have called and left VM for pt to pick up before 4:30 pm today.

## 2019-05-15 NOTE — Telephone Encounter (Signed)
I called to check on the status of the delivery. I spoke to Drew Kent and he said it is enroute and should be delivered today. He does not know the exact location.

## 2019-05-16 NOTE — Telephone Encounter (Signed)
Noted. For when he calls, ASA is ok to take with Epclusa.  Please notify us of anything else he wants to take (preferably before he takes it)

## 2019-05-17 NOTE — Addendum Note (Signed)
Addended by: Gordy Levan, Adalena Abdulla A on: 05/17/2019 02:32 PM   Modules accepted: Orders

## 2019-05-17 NOTE — Telephone Encounter (Signed)
He pick up his medication 04/17/19. He should have 4 week labs, I will put the order in. Have him go tomorrow or Monday. Call if any questions.

## 2019-05-17 NOTE — Telephone Encounter (Signed)
PT wants to know when he will have labs again?

## 2019-05-17 NOTE — Telephone Encounter (Signed)
PT is aware Ok to take the ASA but try to let us know BEFORE he takes anything with the Epclusa and he said that he will do so.

## 2019-05-17 NOTE — Telephone Encounter (Signed)
PT's wife, Sharee Pimple, is aware and I will fax the orders to [facility] lab per her request. Faxed.

## 2019-05-18 ENCOUNTER — Other Ambulatory Visit (HOSPITAL_COMMUNITY)
Admission: RE | Admit: 2019-05-18 | Discharge: 2019-05-18 | Disposition: A | Discharge status: Home / Self Care | Payer: BC Managed Care – PPO | Source: Ambulatory Visit | Attending: Nurse Practitioner | Admitting: Nurse Practitioner

## 2019-05-18 DIAGNOSIS — B1920 Unspecified viral hepatitis C without hepatic coma: Secondary | ICD-10-CM | POA: Insufficient documentation

## 2019-05-18 LAB — COMPREHENSIVE METABOLIC PANEL
ALT: 27 U/L (ref 0–44)
AST: 24 U/L (ref 15–41)
Albumin: 3.9 g/dL (ref 3.5–5.0)
Alkaline Phosphatase: 56 U/L (ref 38–126)
Anion gap: 8 (ref 5–15)
BUN: 12 mg/dL (ref 6–20)
CO2: 25 mmol/L (ref 22–32)
Calcium: 9.1 mg/dL (ref 8.9–10.3)
Chloride: 105 mmol/L (ref 98–111)
Creatinine, Ser: 1.06 mg/dL (ref 0.61–1.24)
GFR calc Af Amer: >60 mL/min (ref >60)
GFR calc non Af Amer: >60 mL/min (ref >60)
Glucose, Bld: 117 mg/dL — ABNORMAL HIGH (ref 70–99)
Potassium: 4.4 mmol/L (ref 3.5–5.1)
Sodium: 138 mmol/L (ref 135–145)
Total Bilirubin: 0.5 mg/dL (ref 0.3–1.2)
Total Protein: 7.5 g/dL (ref 6.5–8.1)

## 2019-05-18 LAB — CBC WITH DIFFERENTIAL/PLATELET
Abs Immature Granulocytes: 0.04 10*3/uL (ref 0.00–0.07)
Basophils Absolute: 0.1 10*3/uL (ref 0.0–0.1)
Basophils Relative: 1 %
Eosinophils Absolute: 0.2 10*3/uL (ref 0.0–0.5)
Eosinophils Relative: 2 %
HCT: 47.7 % (ref 39.0–52.0)
Hemoglobin: 15.9 g/dL (ref 13.0–17.0)
Immature Granulocytes: 0 %
Lymphocytes Relative: 21 %
Lymphs Abs: 1.9 10*3/uL (ref 0.7–4.0)
MCH: 31.9 pg (ref 26.0–34.0)
MCHC: 33.3 g/dL (ref 30.0–36.0)
MCV: 95.6 fL (ref 80.0–100.0)
Monocytes Absolute: 0.5 10*3/uL (ref 0.1–1.0)
Monocytes Relative: 6 %
Neutro Abs: 6.6 10*3/uL (ref 1.7–7.7)
Neutrophils Relative %: 70 %
Platelets: 225 10*3/uL (ref 150–400)
RBC: 4.99 MIL/uL (ref 4.22–5.81)
RDW: 12.4 % (ref 11.5–15.5)
WBC: 9.3 10*3/uL (ref 4.0–10.5)
nRBC: 0 % (ref 0.0–0.2)

## 2019-05-19 LAB — HCV RNA QUANT: HCV Quantitative: NOT DETECTED IU/mL (ref >50)

## 2019-06-04 ENCOUNTER — Telehealth: Payer: Self-pay

## 2019-06-04 ENCOUNTER — Telehealth: Payer: Self-pay | Admitting: Gastroenterology

## 2019-06-04 NOTE — Telephone Encounter (Signed)
Opened in error

## 2019-06-04 NOTE — Telephone Encounter (Signed)
[phone]  Patient called and said he was returning a call from Friday

## 2019-06-04 NOTE — Progress Notes (Signed)
LMOM for a return call.  

## 2019-06-05 NOTE — Telephone Encounter (Signed)
I spoke to the pt yesterday, see separate note.

## 2019-06-07 ENCOUNTER — Other Ambulatory Visit: Payer: Self-pay | Admitting: Physician Assistant

## 2019-06-07 ENCOUNTER — Telehealth: Payer: Self-pay

## 2019-06-07 DIAGNOSIS — E785 Hyperlipidemia, unspecified: Secondary | ICD-10-CM

## 2019-06-07 NOTE — Telephone Encounter (Signed)
PT picked up Epclusa.

## 2019-06-07 NOTE — Telephone Encounter (Signed)
Pt is aware Drew Kent has arrived.  He will come this afternoon and pick it up.

## 2019-06-15 ENCOUNTER — Other Ambulatory Visit (HOSPITAL_COMMUNITY)
Admission: RE | Admit: 2019-06-15 | Discharge: 2019-06-15 | Disposition: A | Discharge status: Home / Self Care | Payer: BC Managed Care – PPO | Source: Ambulatory Visit | Attending: Physician Assistant | Admitting: Physician Assistant

## 2019-06-15 DIAGNOSIS — E785 Hyperlipidemia, unspecified: Secondary | ICD-10-CM

## 2019-06-15 DIAGNOSIS — R69 Illness, unspecified: Secondary | ICD-10-CM | POA: Diagnosis present

## 2019-06-15 LAB — COMPREHENSIVE METABOLIC PANEL
ALT: 29 U/L (ref 0–44)
AST: 23 U/L (ref 15–41)
Albumin: 3.9 g/dL (ref 3.5–5.0)
Alkaline Phosphatase: 54 U/L (ref 38–126)
Anion gap: 6 (ref 5–15)
BUN: 13 mg/dL (ref 6–20)
CO2: 23 mmol/L (ref 22–32)
Calcium: 8.6 mg/dL — ABNORMAL LOW (ref 8.9–10.3)
Chloride: 108 mmol/L (ref 98–111)
Creatinine, Ser: 0.85 mg/dL (ref 0.61–1.24)
GFR calc Af Amer: >60 mL/min (ref >60)
GFR calc non Af Amer: >60 mL/min (ref >60)
Glucose, Bld: 107 mg/dL — ABNORMAL HIGH (ref 70–99)
Potassium: 4.2 mmol/L (ref 3.5–5.1)
Sodium: 137 mmol/L (ref 135–145)
Total Bilirubin: 0.6 mg/dL (ref 0.3–1.2)
Total Protein: 7.5 g/dL (ref 6.5–8.1)

## 2019-06-15 LAB — LIPID PANEL
Cholesterol: 232 mg/dL — ABNORMAL HIGH (ref 0–200)
HDL: 36 mg/dL — ABNORMAL LOW (ref >40)
LDL Cholesterol: 169 mg/dL — ABNORMAL HIGH (ref 0–99)
Total CHOL/HDL Ratio: 6.4 RATIO
Triglycerides: 133 mg/dL (ref <150)
VLDL: 27 mg/dL (ref 0–40)

## 2019-06-18 ENCOUNTER — Ambulatory Visit: Payer: Self-pay | Admitting: Physician Assistant

## 2019-06-18 ENCOUNTER — Encounter: Payer: Self-pay | Admitting: Physician Assistant

## 2019-06-18 DIAGNOSIS — L989 Disorder of the skin and subcutaneous tissue, unspecified: Secondary | ICD-10-CM

## 2019-06-18 DIAGNOSIS — B1920 Unspecified viral hepatitis C without hepatic coma: Secondary | ICD-10-CM

## 2019-06-18 DIAGNOSIS — E669 Obesity, unspecified: Secondary | ICD-10-CM

## 2019-06-18 DIAGNOSIS — E785 Hyperlipidemia, unspecified: Secondary | ICD-10-CM

## 2019-06-18 DIAGNOSIS — F17200 Nicotine dependence, unspecified, uncomplicated: Secondary | ICD-10-CM

## 2019-06-18 NOTE — Progress Notes (Signed)
There were no vitals taken for this visit.   Subjective:    Patient ID: Drew Kent, male    DOB: [birth year], 53 y.o.   MRN: [id number]  HPI: Drew Kent is a 53 y.o. male presenting on 06/18/2019 for No chief complaint on file.   HPI    This is a telemedicine appointment due to coronavirus pandemic.  It is via Telephone as pt does not have video enable device  I connected with  Drew Kent on 06/18/19 by a video enabled telemedicine application and verified that I am speaking with the correct person using two identifiers.   I discussed the limitations of evaluation and management by telemedicine. The patient expressed understanding and agreed to proceed.  Pt is at home.  Provider is at office     Pt says he does not have [place].  He has [place] financial assistance through 07/31/19  He is on epclusa for hep c  He is still smoking a little bit.  No more drinking.    He knows he needs to lose weight.    He is not exercising.  He hopes to lose some weight with diet and then start exercising.    He says he doesn't want to exercise until he loses weight.   He wears a mask when he goes out.       Relevant past medical, surgical, family and social history reviewed and updated as indicated. Interim medical history since our last visit reviewed. Allergies and medications reviewed and updated.   Current Outpatient Medications:  .  ibuprofen (ADVIL) 200 MG tablet, Take 600 mg by mouth daily as needed for headache or moderate pain., Disp: , Rfl:  .  loratadine (CLARITIN) 10 MG tablet, Take 10 mg by mouth daily., Disp: , Rfl:  .  OVER THE COUNTER MEDICATION, Triderma Cream for psoriasis as directed, Disp: , Rfl:  .  Sofosbuvir-Velpatasvir (EPCLUSA PO), Take by mouth., Disp: , Rfl:  .  NON FORMULARY, Revitaderm cream for psoriasis  As directed, Disp: , Rfl:      Review of Systems  Per HPI unless specifically indicated above     Objective:    There were no vitals taken for  this visit.  Wt Readings from Last 3 Encounters:  03/12/19 (!) 312 lb (141.5 kg)  10/30/18 297 lb (134.7 kg)  10/10/18 290 lb (131.5 kg)    Physical Exam Pulmonary:     Effort: No respiratory distress.  Neurological:     Mental Status: He is alert and oriented to person, place, and time.  Psychiatric:        Attention and Perception: Attention normal.        Speech: Speech normal.        Behavior: Behavior is cooperative.     Results for orders placed or performed during the hospital encounter of 06/15/19  Lipid panel  Result Value Ref Range   Cholesterol 232 (H) 0 - 200 mg/dL   Triglycerides 133 <150 mg/dL   HDL 36 (L) >40 mg/dL   Total CHOL/HDL Ratio 6.4 RATIO   VLDL 27 0 - 40 mg/dL   LDL Cholesterol 169 (H) 0 - 99 mg/dL  Comprehensive metabolic panel  Result Value Ref Range   Sodium 137 135 - 145 mmol/L   Potassium 4.2 3.5 - 5.1 mmol/L   Chloride 108 98 - 111 mmol/L   CO2 23 22 - 32 mmol/L   Glucose, Bld 107 (H) 70 - 99 mg/dL   BUN  13 6 - 20 mg/dL   Creatinine, Ser 0.85 0.61 - 1.24 mg/dL   Calcium 8.6 (L) 8.9 - 10.3 mg/dL   Total Protein 7.5 6.5 - 8.1 g/dL   Albumin 3.9 3.5 - 5.0 g/dL   AST 23 15 - 41 U/L   ALT 29 0 - 44 U/L   Alkaline Phosphatase 54 38 - 126 U/L   Total Bilirubin 0.6 0.3 - 1.2 mg/dL   GFR calc non Af Amer >60 >60 mL/min   GFR calc Af Amer >60 >60 mL/min   Anion gap 6 5 - 15      Assessment & Plan:   Encounter Diagnoses  Name Primary?  . Hyperlipidemia, unspecified hyperlipidemia type Yes  . Tobacco use disorder   . Hepatitis C virus infection without hepatic coma, unspecified chronicity   . Psoriasis-like skin disease   . Obesity, unspecified classification, unspecified obesity type, unspecified whether serious comorbidity present       -reviewed labs ith pt  -lowfat diet and exercise for lipids.  Will likely start statin after pt completes hepatits treatment -pt to continue with GI for hepatitis treatment -counseled smoking  cessation -encouraged pt to do exercising to help with his weight loss efforts -pt to follow up 3 months.  He is to contact office sooner prn

## 2019-07-24 ENCOUNTER — Ambulatory Visit: Payer: Self-pay | Admitting: Nurse Practitioner

## 2019-08-15 ENCOUNTER — Telehealth: Payer: Self-pay | Admitting: Gastroenterology

## 2019-08-15 ENCOUNTER — Encounter: Payer: Self-pay | Admitting: Gastroenterology

## 2019-08-15 ENCOUNTER — Ambulatory Visit: Payer: Self-pay | Admitting: Nurse Practitioner

## 2019-08-15 NOTE — Progress Notes (Deleted)
Referring Provider: Soyla Dryer, PA-C Primary Care Physician:  Soyla Dryer, PA-C Primary GI:  Dr. Oneida Alar  No chief complaint on file.   HPI:   Drew Kent is a 53 y.o. male who presents for follow-up on hepatitis C.  The patient was last seen in our office 01/09/2019 for the same as well as rectal bleeding.  His last visit was a virtual office visit due to COVID-19/coronavirus pandemic.  History of chronic hepatitis C, elevated LFTs, history of alcohol abuse in remission.  Mild ALT elevation at 71.  Hepatitis C antibody positive with 12.5 million copies.  Occasional morning time discomfort that resolves upon getting up and moving around.  Rare hematochezia since childhood.  No other overt GI or hepatic symptoms.  High risk of previous drug use (last use in 2010), birth cohort, tattoos received in jail, blood product transfusion.  Recommended additional labs for hepatitis C work-up for consideration of treatment, ultrasound elastography, colonoscopy due to rectal bleeding, follow-up in 2 months.  Labs completed 02/08/2019 found positive hepatitis A antibody, positive hepatitis B core IgM, positive, hepatitis B surface antibody and surface antigen (indicative of previous hepatitis B infection) hepatitis C genotype.  Originally ordered hepatitis B total core antibody but somebody in the lab reordered his labs and entered the wrong test (hepatitis B core IgM).  HIV was negative.  CBC normal including platelet count at 191.  CMP with mild transaminitis with AST/ALT at 65/74, otherwise normal.  Total abdominal ultrasound completed 02/14/2019 found normal size spleen, normal parenchymal echogenicity.  Elastography found Matavir fibrosis score of F2 and some F3.  Colonoscopy completed 03/12/2019 which found two 3 to 5 mm polyps, external and internal hemorrhoids.  Surgical pathology found the polyps to be tubular adenoma and recommended repeat colonoscopy in 5 to 7 years (20 25-20 51).  Today he  states   Past Medical History:  Diagnosis Date  . Arthritis   . Chronic hepatitis C ([place])     Past Surgical History:  Procedure Laterality Date  . COLONOSCOPY WITH PROPOFOL N/A 03/12/2019   Procedure: COLONOSCOPY WITH PROPOFOL;  Surgeon: Danie Binder, MD;  Location: [facility];  Service: Endoscopy;  Laterality: N/A;  2:15pm  . HERNIA REPAIR    . POLYPECTOMY  03/12/2019   Procedure: POLYPECTOMY;  Surgeon: Danie Binder, MD;  Location: [facility];  Service: Endoscopy;;  colon  . WRIST ARTHROSCOPY      Current Outpatient Medications  Medication Sig Dispense Refill  . ibuprofen (ADVIL) 200 MG tablet Take 600 mg by mouth daily as needed for headache or moderate pain.    Marland Kitchen loratadine (CLARITIN) 10 MG tablet Take 10 mg by mouth daily.    . NON FORMULARY Revitaderm cream for psoriasis  As directed    . OVER THE COUNTER MEDICATION Triderma Cream for psoriasis as directed    . Sofosbuvir-Velpatasvir (EPCLUSA PO) Take by mouth.     No current facility-administered medications for this visit.     Allergies as of 08/15/2019  . (No Known Allergies)    Family History  Problem Relation Age of Onset  . Heart failure Mother   . Heart disease Mother   . Cancer Other   . Diabetes Other   . Heart failure Other   . Colon cancer Neg Hx   . Colon polyps Neg Hx     Social History   Socioeconomic History  . Marital status: Married    Spouse name: Not on file  .  Number of children: Not on file  . Years of education: Not on file  . Highest education level: Not on file  Occupational History  . Not on file  Social Needs  . Financial resource strain: Not on file  . Food insecurity    Worry: Not on file    Inability: Not on file  . Transportation needs    Medical: Not on file    Non-medical: Not on file  Tobacco Use  . Smoking status: Current Every Day Smoker    Packs/day: 0.25    Types: Cigarettes, E-cigarettes  . Smokeless tobacco: Never Used  Substance and Sexual Activity   . Alcohol use: Not Currently    Comment: history of alcoholism (last ETOH 08/17/2018)  . Drug use: Not Currently    Types: Cocaine  . Sexual activity: Not on file  Lifestyle  . Physical activity    Days per week: Not on file    Minutes per session: Not on file  . Stress: Not on file  Relationships  . Social Herbalist on phone: Not on file    Gets together: Not on file    Attends religious service: Not on file    Active member of club or organization: Not on file    Attends meetings of clubs or organizations: Not on file    Relationship status: Not on file  Other Topics Concern  . Not on file  Social History Narrative  . Not on file    Review of Systems: General: Negative for anorexia, weight loss, fever, chills, fatigue, weakness. Eyes: Negative for vision changes.  ENT: Negative for hoarseness, difficulty swallowing , nasal congestion. CV: Negative for chest pain, angina, palpitations, dyspnea on exertion, peripheral edema.  Respiratory: Negative for dyspnea at rest, dyspnea on exertion, cough, sputum, wheezing.  GI: See history of present illness. GU:  Negative for dysuria, hematuria, urinary incontinence, urinary frequency, nocturnal urination.  MS: Negative for joint pain, low back pain.  Derm: Negative for rash or itching.  Neuro: Negative for weakness, abnormal sensation, seizure, frequent headaches, memory loss, confusion.  Psych: Negative for anxiety, depression, suicidal ideation, hallucinations.  Endo: Negative for unusual weight change.  Heme: Negative for bruising or bleeding. Allergy: Negative for rash or hives.   Physical Exam: There were no vitals taken for this visit. General:   Alert and oriented. Pleasant and cooperative. Well-nourished and well-developed.  Head:  Normocephalic and atraumatic. Eyes:  Without icterus, sclera clear and conjunctiva pink.  Ears:  Normal auditory acuity. Mouth:  No deformity or lesions, oral mucosa pink.   Throat/Neck:  Supple, without mass or thyromegaly. Cardiovascular:  S1, S2 present without murmurs appreciated. Normal pulses noted. Extremities without clubbing or edema. Respiratory:  Clear to auscultation bilaterally. No wheezes, rales, or rhonchi. No distress.  Gastrointestinal:  +BS, soft, non-tender and non-distended. No HSM noted. No guarding or rebound. No masses appreciated.  Rectal:  Deferred  Musculoskalatal:  Symmetrical without gross deformities. Normal posture. Skin:  Intact without significant lesions or rashes. Neurologic:  Alert and oriented x4;  grossly normal neurologically. Psych:  Alert and cooperative. Normal mood and affect. Heme/Lymph/Immune: No significant cervical adenopathy. No excessive bruising noted.    08/15/2019 7:50 AM   Disclaimer: This note was dictated with voice recognition software. Similar sounding words can inadvertently be transcribed and may not be corrected upon review.

## 2019-08-15 NOTE — Telephone Encounter (Signed)
PATIENT WAS A NO SHOW AND LETTER SENT  °

## 2019-08-20 ENCOUNTER — Other Ambulatory Visit: Payer: Self-pay | Admitting: Physician Assistant

## 2019-08-20 MED ORDER — INDOMETHACIN 50 MG PO CAPS: 50.0000 mg | ORAL_CAPSULE | Freq: Three times a day (TID) | ORAL | 2 refills | Status: DC | PRN

## 2019-08-21 ENCOUNTER — Telehealth: Payer: Self-pay | Admitting: Student

## 2019-08-21 NOTE — Telephone Encounter (Signed)
Pt called and left vm on nurse's line on 08-20-19 c/o gout attack for the past 18-19 days and requesting gout medication to be sent to [place].   PA rx Indocin to [facility] in [place]. LPN called and left voicemail with pt on 08-20-19 notifying him that rx has been sent.  LPN called pt again on 08-21-19 and notified him of above. Pt verbalized understanding.

## 2019-10-15 ENCOUNTER — Other Ambulatory Visit: Payer: Self-pay

## 2019-10-15 ENCOUNTER — Ambulatory Visit: Payer: BC Managed Care – PPO | Admitting: Physician Assistant

## 2020-03-02 IMAGING — US ULTRASOUND ABDOMEN COMPLETE
1 series · 13 of 25 positions shown · non-contrast
Comparison: None.

CLINICAL DATA: Chronic hepatitis-C without hepatic coma. Ethanol
misuse.



[Series 1: ultrasound abdomen complete · 13 of 90 slices shown]
[im 1/90]
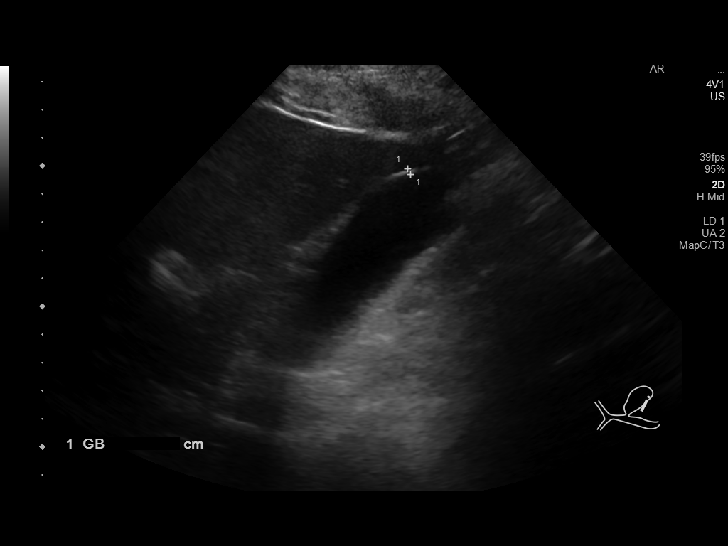
[im 8/90]
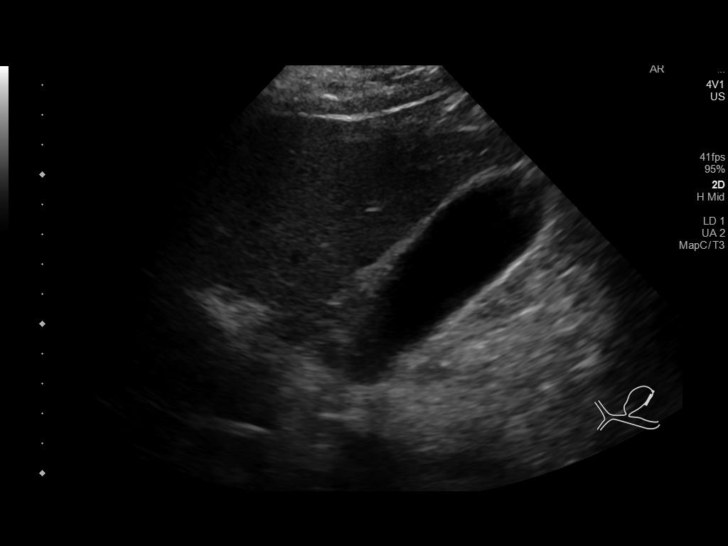
[im 15/90]
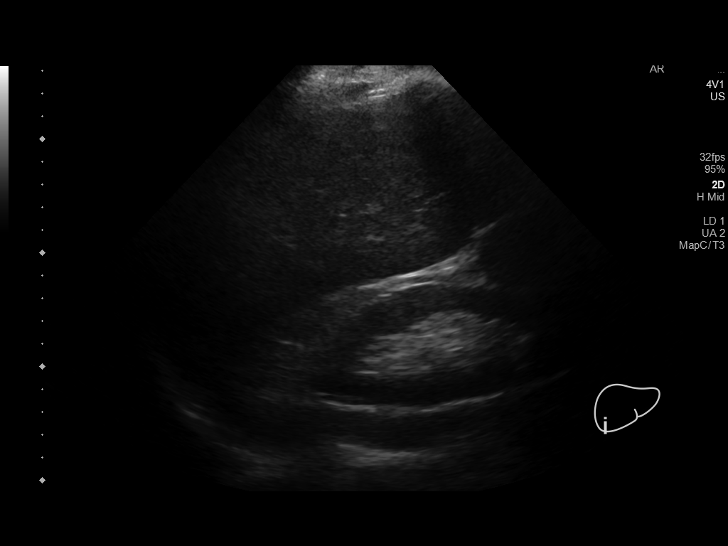
[im 23/90]
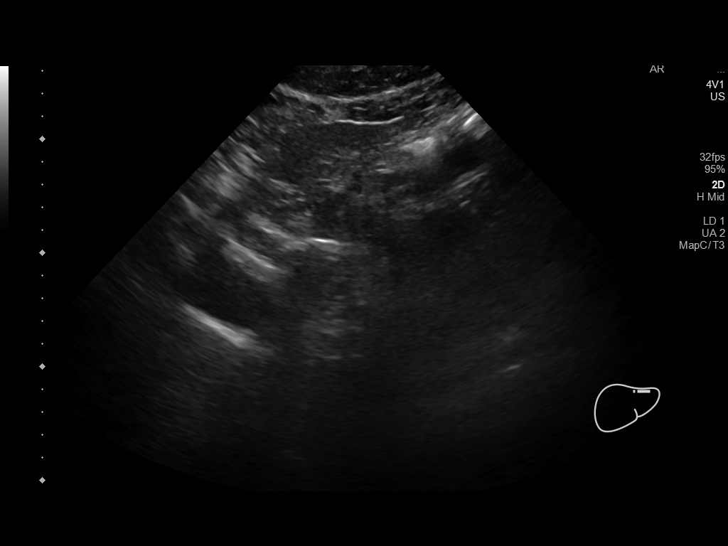
[im 30/90]
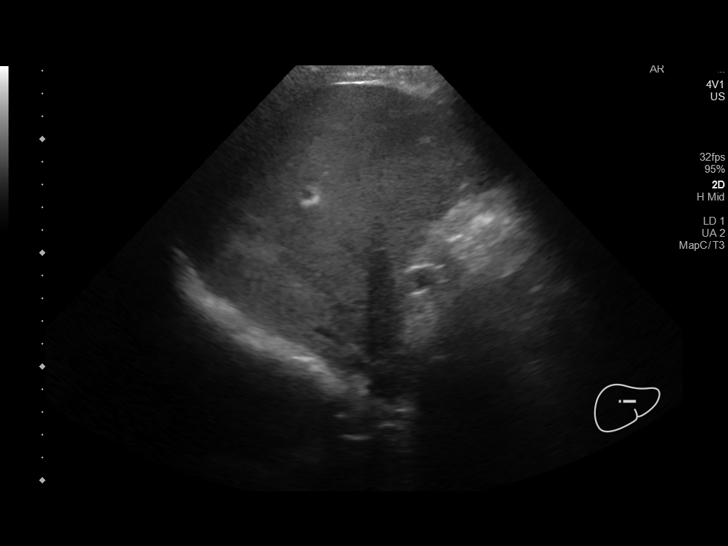
[im 38/90]
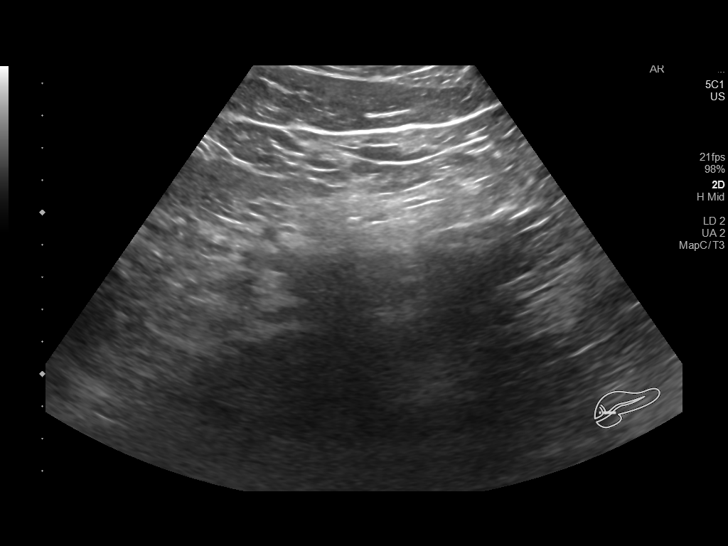
[im 45/90]
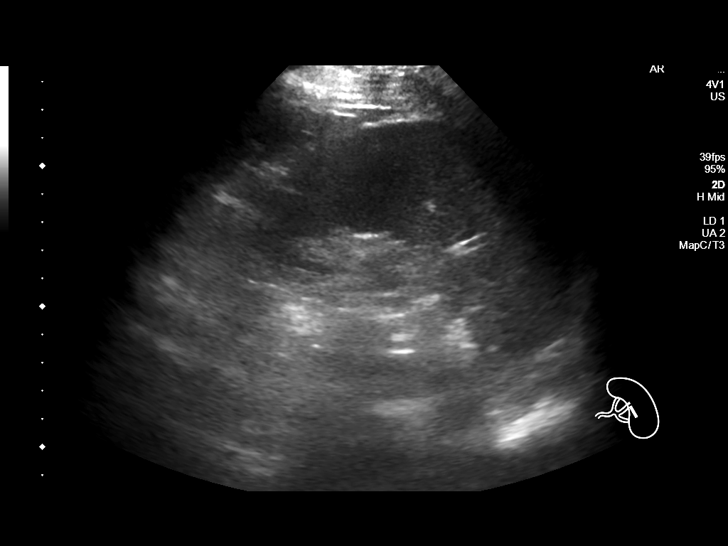
[im 52/90]
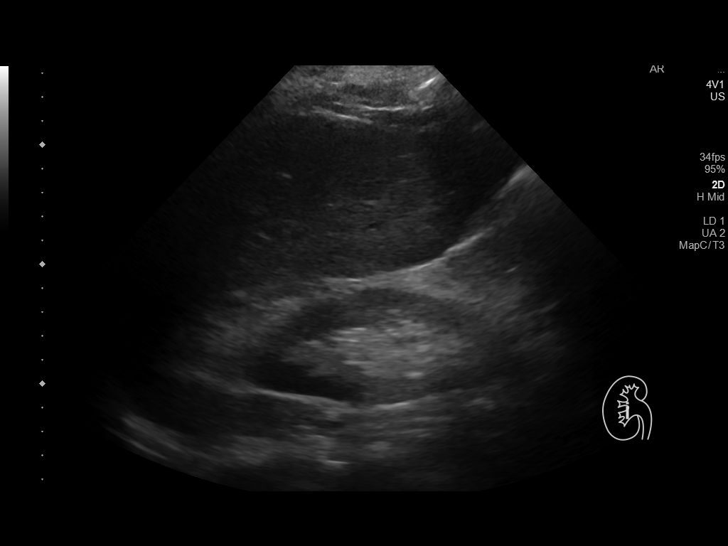
[im 60/90]
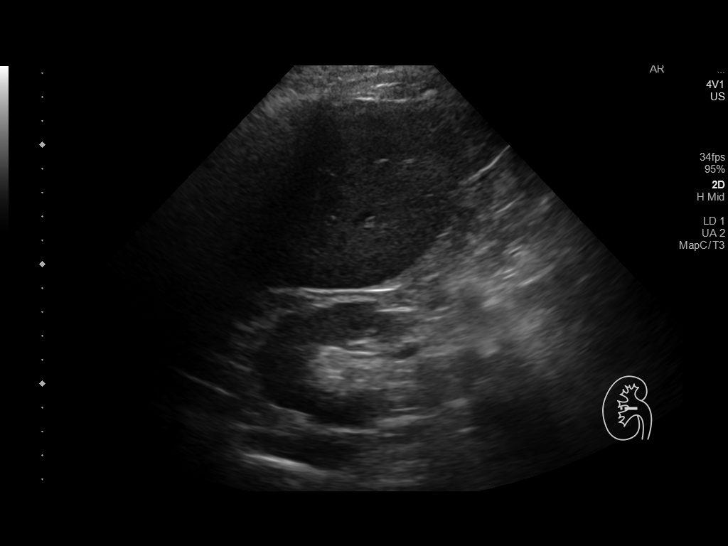
[im 67/90]
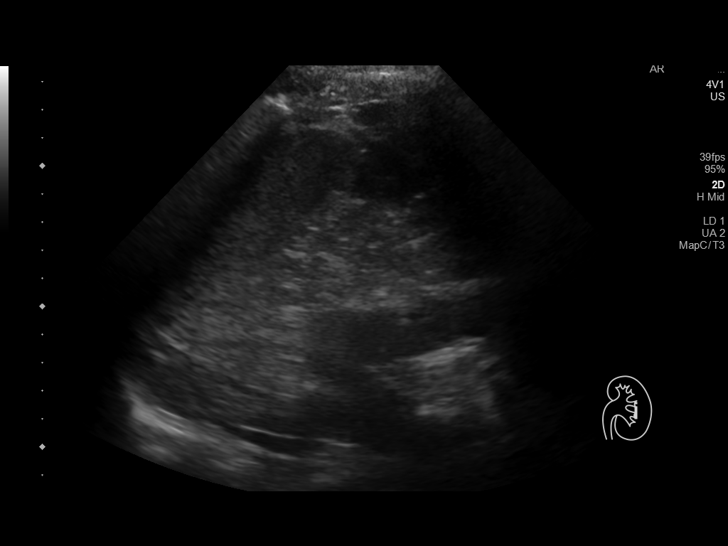
[im 75/90]
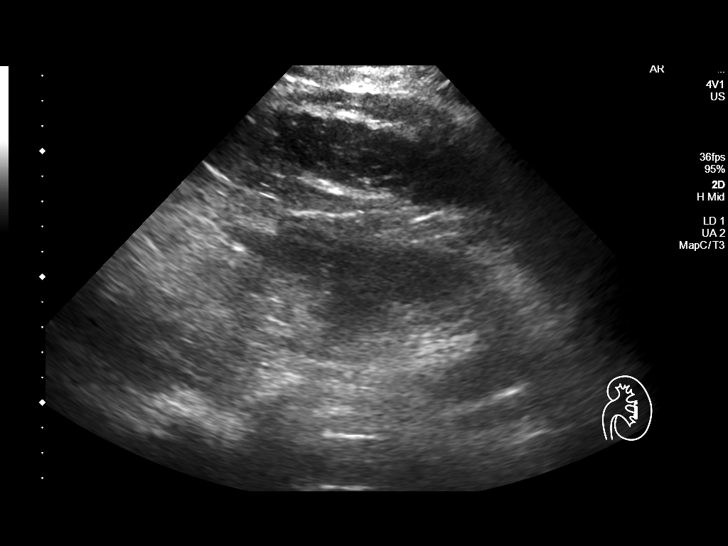
[im 82/90]
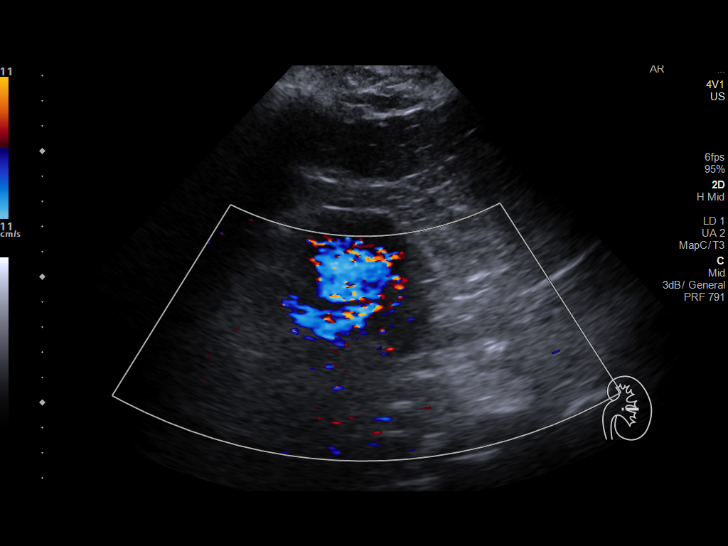
[im 90/90]
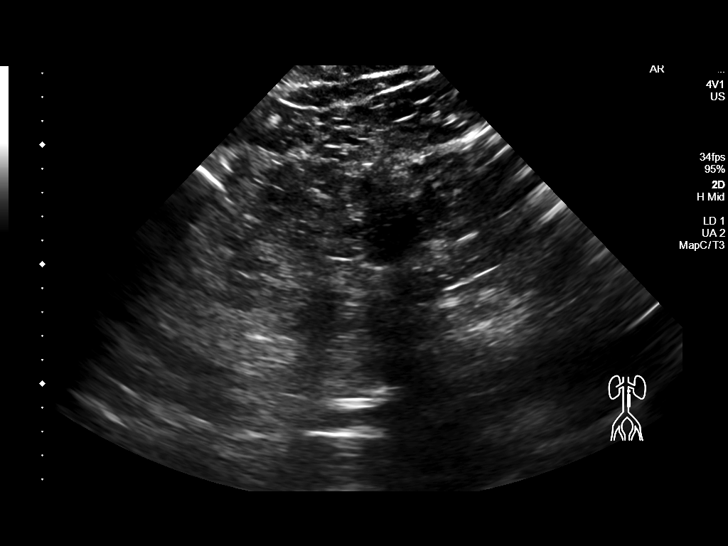

[13 of 25 positions shown; findings below may reference images not displayed]

FINDINGS: ULTRASOUND ABDOMEN

Gallbladder: No gallstones or wall thickening visualized. No
sonographic Murphy sign noted by sonographer.

Common bile duct: Diameter: 4 mm, within normal limits.

Liver: No focal lesion identified. Within normal limits in
parenchymal echogenicity. Portal vein is patent on color Doppler
imaging with normal direction of blood flow towards the liver.

IVC: No abnormality visualized.

Pancreas: Not visualized due to overlying bowel gas.

Spleen: Size and appearance within normal limits.

Right Kidney: Length: 12.4 cm. Echogenicity within normal limits. No
mass or hydronephrosis visualized.

Left Kidney: Length: 14.6 cm. Echogenicity within normal limits. No
mass or hydronephrosis visualized.

Abdominal aorta: No aneurysm visualized.

Other findings: None.

ULTRASOUND HEPATIC ELASTOGRAPHY

Device: Siemens Helix VTQ

Patient position: Left Lateral Decubitus

Transducer 4V1

Number of measurements: 10

Hepatic segment:  8

Median velocity:   1.42 m/sec

IQR:

IQR/Median velocity ratio:

Corresponding Metavir fibrosis score:  F2 + some F3

Risk of fibrosis: Moderate

Limitations of exam: None

Please note that abnormal shear wave velocities may also be
identified in clinical settings other than with hepatic fibrosis,
such as: acute hepatitis, elevated right heart and central venous
pressures including use of beta blockers, Jesnor disease
(Arianitap), infiltrative processes such as
mastocytosis/amyloidosis/infiltrative tumor, extrahepatic
cholestasis, in the post-prandial state, and liver transplantation.
Correlation with patient history, laboratory data, and clinical
condition recommended.
IMPRESSION: ULTRASOUND ABDOMEN:

Negative abdomen ultrasound.

ULTRASOUND HEPATIC ELASTOGRAPHY:

Median hepatic shear wave velocity is calculated at 1.42 m/sec.

Corresponding Metavir fibrosis score is F2 + some F3.

Risk of fibrosis is Moderate.

Follow-up: Additional testing appropriate

## 2020-03-02 IMAGING — US US LIVER ELASTOGRAPHY
1 series · 13 of 23 positions shown · non-contrast
Comparison: None.

CLINICAL DATA: Chronic hepatitis-C without hepatic coma. Ethanol
misuse.



[Series 1: us liver elastography · 13 of 23 slices shown]
[im 1/23]
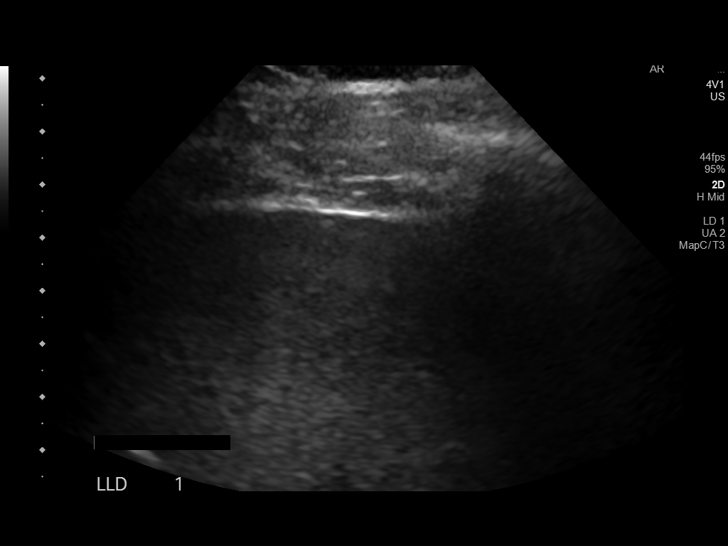
[im 3/23]
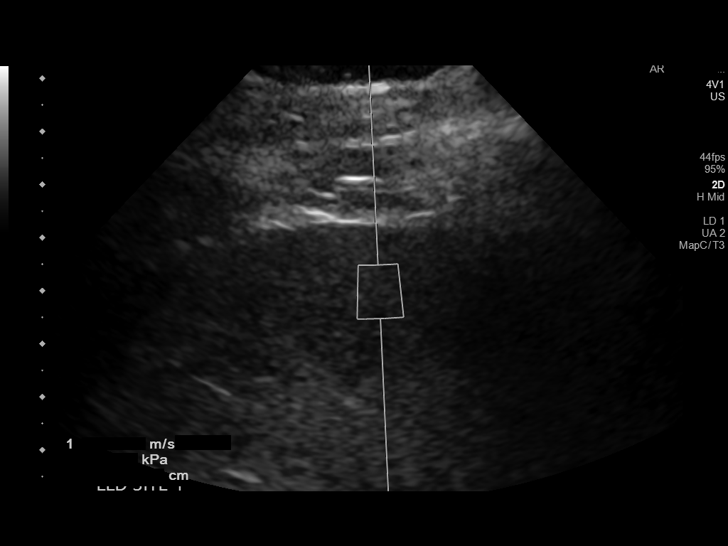
[im 5/23]
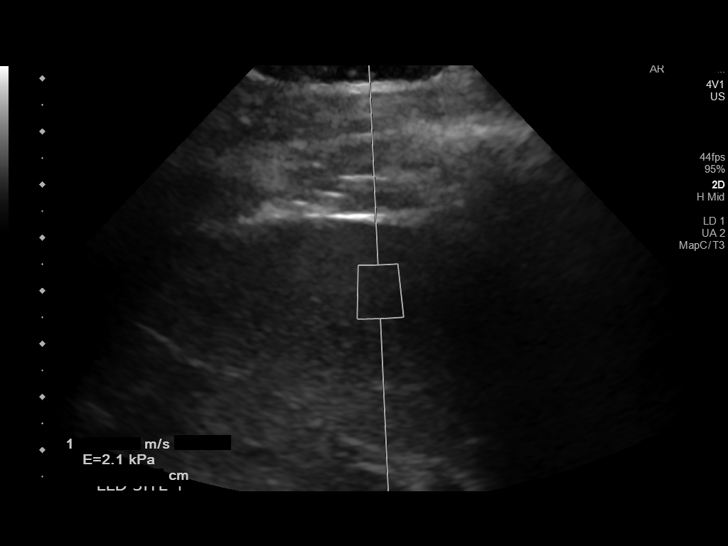
[im 7/23]
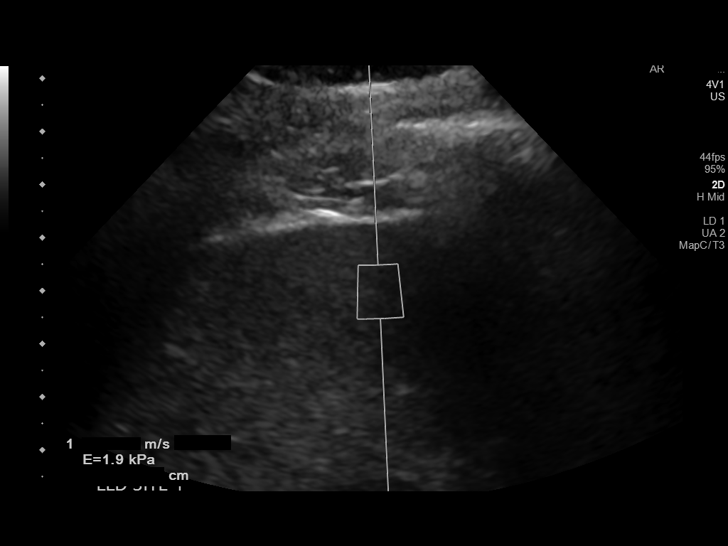
[im 8/23]
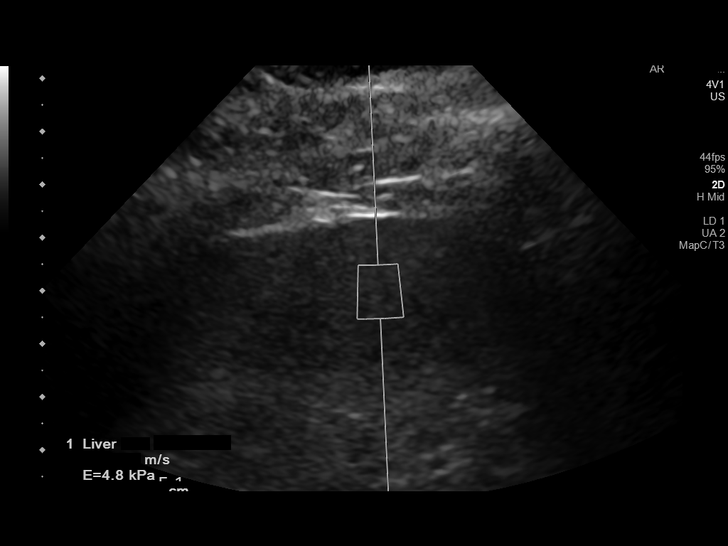
[im 10/23]
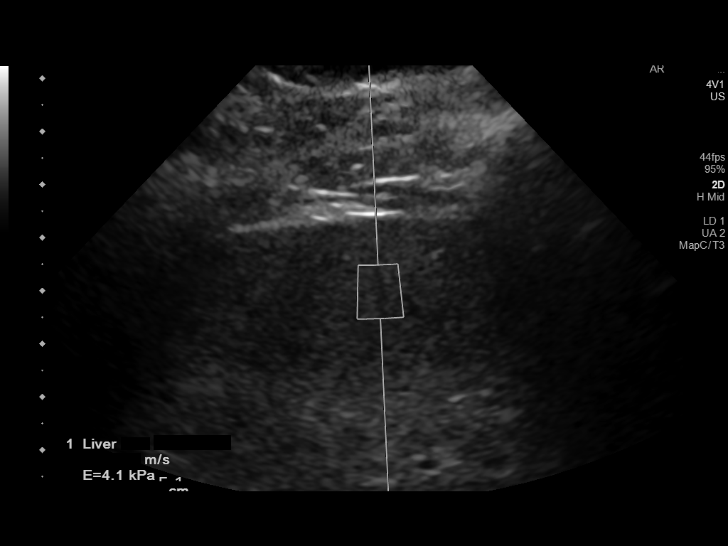
[im 12/23]
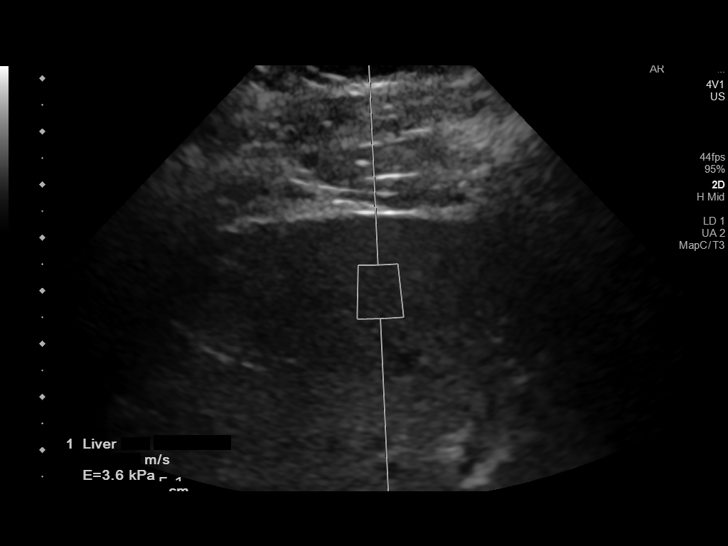
[im 14/23]
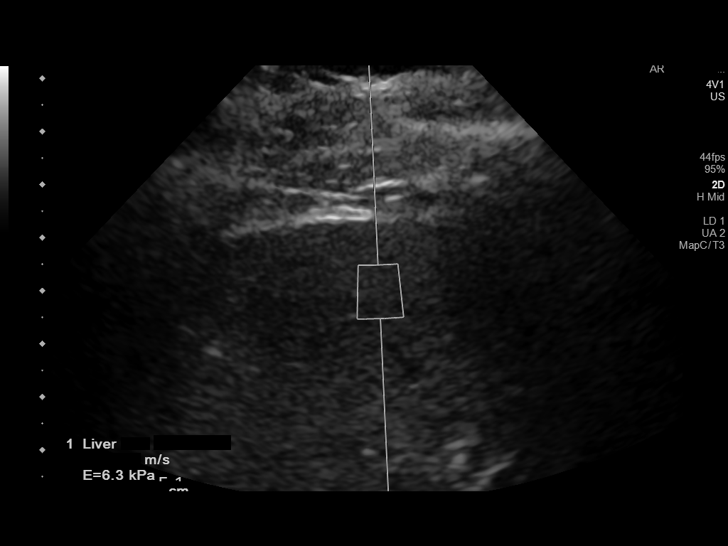
[im 16/23]
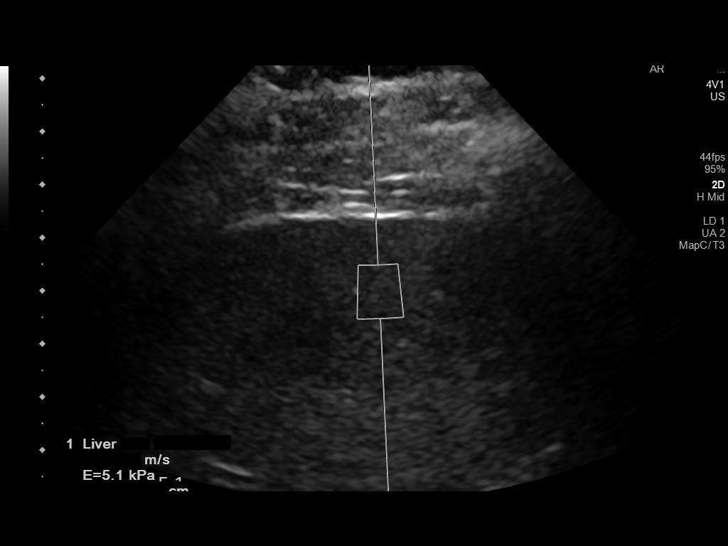
[im 17/23]
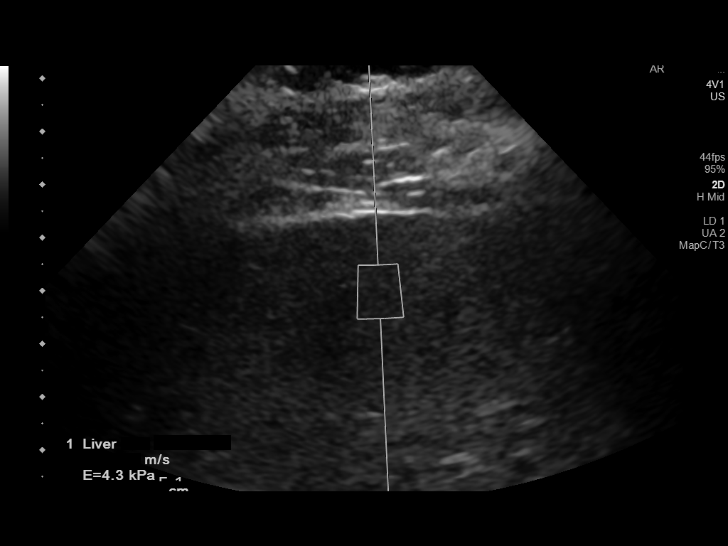
[im 19/23]
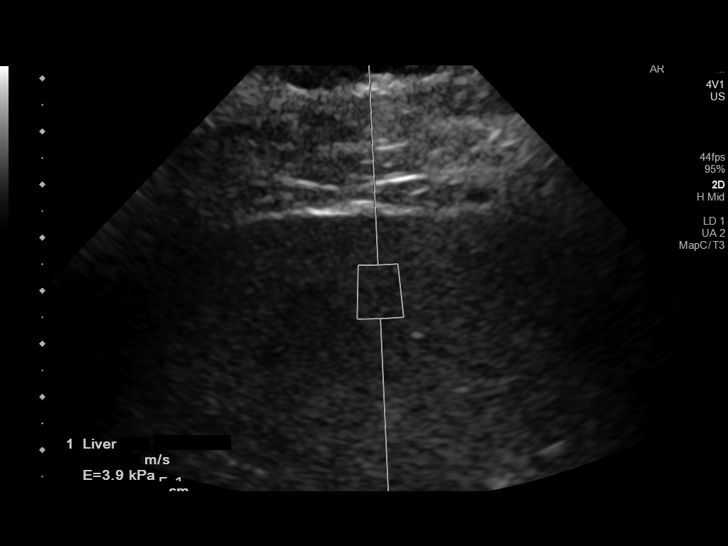
[im 21/23]
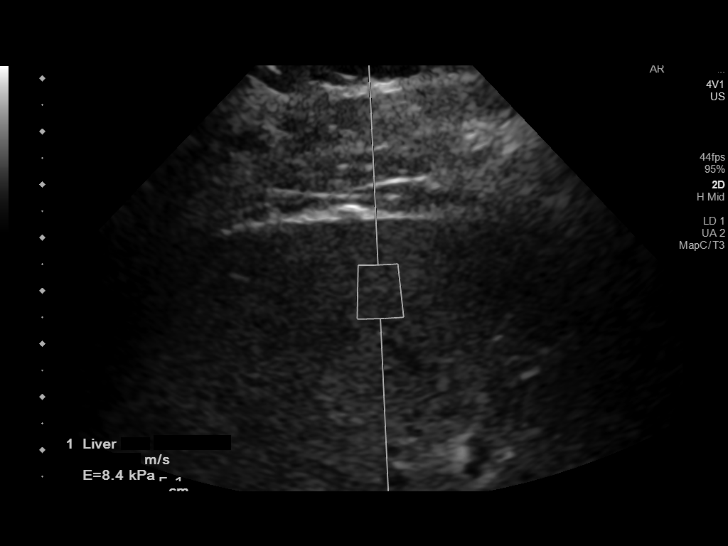
[im 23/23]
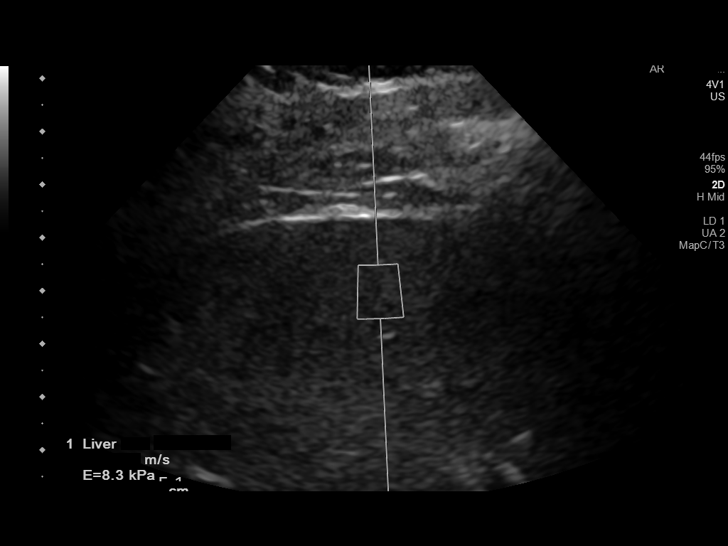

[13 of 23 positions shown; findings below may reference images not displayed]

FINDINGS: ULTRASOUND ABDOMEN

Gallbladder: No gallstones or wall thickening visualized. No
sonographic Murphy sign noted by sonographer.

Common bile duct: Diameter: 4 mm, within normal limits.

Liver: No focal lesion identified. Within normal limits in
parenchymal echogenicity. Portal vein is patent on color Doppler
imaging with normal direction of blood flow towards the liver.

IVC: No abnormality visualized.

Pancreas: Not visualized due to overlying bowel gas.

Spleen: Size and appearance within normal limits.

Right Kidney: Length: 12.4 cm. Echogenicity within normal limits. No
mass or hydronephrosis visualized.

Left Kidney: Length: 14.6 cm. Echogenicity within normal limits. No
mass or hydronephrosis visualized.

Abdominal aorta: No aneurysm visualized.

Other findings: None.

ULTRASOUND HEPATIC ELASTOGRAPHY

Device: Siemens Helix VTQ

Patient position: Left Lateral Decubitus

Transducer 4V1

Number of measurements: 10

Hepatic segment:  8

Median velocity:   1.42 m/sec

IQR:

IQR/Median velocity ratio:

Corresponding Metavir fibrosis score:  F2 + some F3

Risk of fibrosis: Moderate

Limitations of exam: None

Please note that abnormal shear wave velocities may also be
identified in clinical settings other than with hepatic fibrosis,
such as: acute hepatitis, elevated right heart and central venous
pressures including use of beta blockers, Jesnor disease
(Arianitap), infiltrative processes such as
mastocytosis/amyloidosis/infiltrative tumor, extrahepatic
cholestasis, in the post-prandial state, and liver transplantation.
Correlation with patient history, laboratory data, and clinical
condition recommended.
IMPRESSION: ULTRASOUND ABDOMEN:

Negative abdomen ultrasound.

ULTRASOUND HEPATIC ELASTOGRAPHY:

Median hepatic shear wave velocity is calculated at 1.42 m/sec.

Corresponding Metavir fibrosis score is F2 + some F3.

Risk of fibrosis is Moderate.

Follow-up: Additional testing appropriate

## 2020-06-04 DIAGNOSIS — Z8619 Personal history of other infectious and parasitic diseases: Secondary | ICD-10-CM | POA: Insufficient documentation

## 2020-06-04 DIAGNOSIS — M109 Gout, unspecified: Secondary | ICD-10-CM | POA: Insufficient documentation

## 2020-06-04 DIAGNOSIS — Z8679 Personal history of other diseases of the circulatory system: Secondary | ICD-10-CM | POA: Insufficient documentation

## 2020-09-10 DIAGNOSIS — L4050 Arthropathic psoriasis, unspecified: Secondary | ICD-10-CM | POA: Insufficient documentation

## 2021-03-12 DIAGNOSIS — Z227 Latent tuberculosis: Secondary | ICD-10-CM | POA: Insufficient documentation

## 2022-11-26 ENCOUNTER — Telehealth: Payer: Self-pay

## 2022-11-26 NOTE — Telephone Encounter (Addendum)
Called to follow up with client newly renewed with [employer]. He has now been approved for Medicaid. He will come by the office today for tip sheet and list of providers accepting new patients. He states he is not currently on any medications that he is taking.  He states his most concerns would be for referrals for his shoulder and back which he states as "arthritis"  Discussed that [facility] cannot accept Medicaid patients therefore he would need to establish with a primary care provider that is accepting Medicaid. He states understanding.  Client will be into the office today to gather further information at 11 am.  Update Addendum: 11:00am. Client did state he is needing more referrals for back and shoulder pain and arthritis. Appointment to [facility] cancelled and Lance Morin made aware of reason.  Client was given lists of PCPs accepting new patients as well as his Medicaid plan number to call regarding benefits. Will plan follow up next week as he transitions to Medicaid. [employer]

## 2022-11-29 ENCOUNTER — Ambulatory Visit: Payer: BC Managed Care – PPO | Admitting: Physician Assistant

## 2022-12-02 ENCOUNTER — Telehealth: Payer: Self-pay

## 2022-12-02 NOTE — Telephone Encounter (Signed)
Called to follow up with Drew Kent as he is transitioned to [facility]. He states he has not called to establish primary medical care with a new provider. He states "my shoulder doesn't hurt any longer" Discussed the importance of establishing primary medical care to have for follow up and general ongoing medical care and wellness. He has been provided with a list of options in [place] that are taking new patients and list was given along with a Medicaid tip sheet. He states his concern is dental at this time, Discussed that Medicaid does have dental coverage and he should call his medicaid plan provider and discuss dental providers. Also encouraged him to check with [facility] dental clinic to determine if they are able to take his coverage plan.  He states understanding and states he will call.  [employer]

## 2023-03-21 ENCOUNTER — Emergency Department (HOSPITAL_COMMUNITY)
Admission: EM | Admit: 2023-03-21 | Discharge: 2023-03-21 | Disposition: A | Discharge status: Home / Self Care | Payer: Medicaid Other | Attending: Emergency Medicine | Admitting: Emergency Medicine

## 2023-03-21 ENCOUNTER — Encounter (HOSPITAL_COMMUNITY): Payer: Self-pay

## 2023-03-21 ENCOUNTER — Other Ambulatory Visit: Payer: Self-pay

## 2023-03-21 DIAGNOSIS — L03114 Cellulitis of left upper limb: Secondary | ICD-10-CM | POA: Diagnosis not present

## 2023-03-21 DIAGNOSIS — M7989 Other specified soft tissue disorders: Secondary | ICD-10-CM | POA: Diagnosis present

## 2023-03-21 DIAGNOSIS — L03113 Cellulitis of right upper limb: Secondary | ICD-10-CM

## 2023-03-21 MED ORDER — SULFAMETHOXAZOLE-TRIMETHOPRIM 800-160 MG PO TABS
1.0000 | ORAL_TABLET | Freq: Once | ORAL | Status: AC
  Administered 2023-03-21: 1 via ORAL
  Filled 2023-03-21: qty 1

## 2023-03-21 MED ORDER — SULFAMETHOXAZOLE-TRIMETHOPRIM 800-160 MG PO TABS: 1.0000 | ORAL_TABLET | Freq: Two times a day (BID) | ORAL | 0 refills | Status: AC

## 2023-03-21 NOTE — ED Notes (Signed)
ED Provider at bedside. 

## 2023-03-21 NOTE — ED Triage Notes (Signed)
Pt c/o spider bite that happened 6 days ago. Bite is on right forearm and appears to be red, swollen, warm and tender to touch. Denies fever. No other complaints.

## 2023-03-21 NOTE — Discharge Instructions (Signed)
I have sent an antibiotic into the pharmacy for you.  If symptoms are worsening return to the emergency room.

## 2023-03-21 NOTE — ED Provider Notes (Signed)
[place] EMERGENCY DEPARTMENT AT [facility] Provider Note   CSN: [id number] Arrival date & time: 03/21/23  1093     History  No chief complaint on file.   Drew Kent is a 57 y.o. male.  57 year old male presents today for concern of spider bite to right forearm.  Occurred 6 days ago.  No fever.  It is swollen and red.  Denies other complaints.  Did not see the spider bite him.  States he is around spiders which makes him concerned that it may be a spider bite.  The history is provided by the patient. No language interpreter was used.       Home Medications Prior to Admission medications   Medication Sig Start Date End Date Taking? Authorizing Provider  sulfamethoxazole-trimethoprim (BACTRIM DS) 800-160 MG tablet Take 1 tablet by mouth 2 (two) times daily for 7 days. 03/21/23 03/28/23 Yes Travers Goodley, PA-C  ibuprofen (ADVIL) 200 MG tablet Take 600 mg by mouth daily as needed for headache or moderate pain.    [provider]  indomethacin (INDOCIN) 50 MG capsule Take 1 capsule (50 mg total) by mouth 3 (three) times daily as needed. 08/20/19   Jacquelin Hawking, PA-C  loratadine (CLARITIN) 10 MG tablet Take 10 mg by mouth daily.    [provider]  NON FORMULARY Revitaderm cream for psoriasis  As directed    [provider]  OVER THE COUNTER MEDICATION Triderma Cream for psoriasis as directed    [provider]  Sofosbuvir-Velpatasvir (EPCLUSA PO) Take by mouth.    [provider]      Allergies    Patient has no known allergies.    Review of Systems   Review of Systems  Constitutional:  Negative for fever.  Skin:  Positive for wound.  All other systems reviewed and are negative.   Physical Exam Updated Vital Signs BP 120/79 (BP Location: Right Arm)   Pulse 77   Temp 98.4 F (36.9 C) (Oral)   Resp 13   SpO2 100%  Physical Exam Vitals and nursing note reviewed.  Constitutional:      General: He is not in acute  distress.    Appearance: Normal appearance. He is not ill-appearing.  HENT:     Head: Normocephalic and atraumatic.     Nose: Nose normal.  Eyes:     Conjunctiva/sclera: Conjunctivae normal.  Cardiovascular:     Rate and Rhythm: Normal rate.     Pulses: Normal pulses.  Pulmonary:     Effort: Pulmonary effort is normal. No respiratory distress.  Musculoskeletal:        General: No deformity.     Comments: Area of induration with black central lesion noted to right forearm.  No area of fluctuance.  Neurovascularly intact in the right upper extremity.  Skin:    Findings: No rash.  Neurological:     Mental Status: He is alert.     ED Results / Procedures / Treatments   Labs (all labs ordered are listed, but only abnormal results are displayed) Labs Reviewed - No data to display  EKG None  Radiology No results found.  Procedures Procedures    Medications Ordered in ED Medications  sulfamethoxazole-trimethoprim (BACTRIM DS) 800-160 MG per tablet 1 tablet (has no administration in time range)    ED Course/ Medical Decision Making/ A&P  Medical Decision Making Risk Prescription drug management.   57 year old male presents today for concern of spider bite.  There is evidence of cellulitis.  No fluctuance or evidence of abscess.  Will give dose of Bactrim in the ED.  Will prescribe Bactrim.  Strict return precaution discussed.  Discussed follow-up with PCP.  Patient voices understanding and is in agreement with plan.   Final Clinical Impression(s) / ED Diagnoses Final diagnoses:  Cellulitis of left upper extremity    Rx / DC Orders ED Discharge Orders          Ordered    sulfamethoxazole-trimethoprim (BACTRIM DS) 800-160 MG tablet  2 times daily        03/21/23 2100              Marita Kansas, PA-C 03/21/23 2103    Terrilee Files, MD 03/22/23 [phone]

## 2023-03-21 NOTE — ED Notes (Signed)
PT ambulated to [facility] with out assistance 

## 2023-04-05 ENCOUNTER — Encounter (HOSPITAL_COMMUNITY): Payer: Self-pay

## 2023-04-05 ENCOUNTER — Other Ambulatory Visit: Payer: Self-pay

## 2023-04-05 ENCOUNTER — Emergency Department (HOSPITAL_COMMUNITY)
Admission: EM | Admit: 2023-04-05 | Discharge: 2023-04-06 | Discharge status: Against Medical Advice | Payer: Medicaid Other | Attending: Emergency Medicine | Admitting: Emergency Medicine

## 2023-04-05 DIAGNOSIS — Z5321 Procedure and treatment not carried out due to patient leaving prior to being seen by health care provider: Secondary | ICD-10-CM | POA: Insufficient documentation

## 2023-04-05 DIAGNOSIS — R252 Cramp and spasm: Secondary | ICD-10-CM | POA: Insufficient documentation

## 2023-04-05 LAB — BASIC METABOLIC PANEL
Anion gap: 10 (ref 5–15)
BUN: 29 mg/dL — ABNORMAL HIGH (ref 6–20)
CO2: 24 mmol/L (ref 22–32)
Calcium: 9.6 mg/dL (ref 8.9–10.3)
Chloride: 97 mmol/L — ABNORMAL LOW (ref 98–111)
Creatinine, Ser: 2.74 mg/dL — ABNORMAL HIGH (ref 0.61–1.24)
GFR, Estimated: 26 mL/min — ABNORMAL LOW (ref >60)
Glucose, Bld: 115 mg/dL — ABNORMAL HIGH (ref 70–99)
Potassium: 5.2 mmol/L — ABNORMAL HIGH (ref 3.5–5.1)
Sodium: 131 mmol/L — ABNORMAL LOW (ref 135–145)

## 2023-04-05 LAB — CBC
HCT: 44.4 % (ref 39.0–52.0)
Hemoglobin: 15.6 g/dL (ref 13.0–17.0)
MCH: 32.8 pg (ref 26.0–34.0)
MCHC: 35.1 g/dL (ref 30.0–36.0)
MCV: 93.3 fL (ref 80.0–100.0)
Platelets: 283 10*3/uL (ref 150–400)
RBC: 4.76 MIL/uL (ref 4.22–5.81)
RDW: 12.6 % (ref 11.5–15.5)
WBC: 14.9 10*3/uL — ABNORMAL HIGH (ref 4.0–10.5)
nRBC: 0 % (ref 0.0–0.2)

## 2023-04-05 NOTE — ED Triage Notes (Addendum)
Pt states that he is having full body cramps after working out in the heat today. Pt states that he has remained hydrated today. States he had episodes where he felt like he was going to pass out.   Denies alcohol or drug use

## 2023-04-06 LAB — CK: Total CK: 202 U/L (ref 49–397)

## 2023-04-06 MED ORDER — SODIUM CHLORIDE 0.9 % IV BOLUS: 1000.0000 mL | Freq: Once | INTRAVENOUS | Status: DC

## 2023-04-06 NOTE — ED Notes (Signed)
RN went to introduce herself to pt. Found room empty. Pt is not within the department. EDP Pollina informed of pts elopement

## 2023-06-08 ENCOUNTER — Other Ambulatory Visit (INDEPENDENT_AMBULATORY_CARE_PROVIDER_SITE_OTHER): Payer: Medicaid Other

## 2023-06-08 ENCOUNTER — Ambulatory Visit (INDEPENDENT_AMBULATORY_CARE_PROVIDER_SITE_OTHER): Payer: Medicaid Other | Admitting: Orthopaedic Surgery

## 2023-06-08 ENCOUNTER — Encounter: Payer: Self-pay | Admitting: Orthopaedic Surgery

## 2023-06-08 VITALS — BP 144/80 | HR 66 | Ht 74.0 in | Wt 250.0 lb

## 2023-06-08 DIAGNOSIS — M25562 Pain in left knee: Secondary | ICD-10-CM

## 2023-06-08 DIAGNOSIS — M7122 Synovial cyst of popliteal space [Baker], left knee: Secondary | ICD-10-CM | POA: Diagnosis not present

## 2023-06-08 DIAGNOSIS — G8929 Other chronic pain: Secondary | ICD-10-CM | POA: Diagnosis not present

## 2023-06-08 MED ORDER — METHYLPREDNISOLONE ACETATE 40 MG/ML IJ SUSP
40.0000 mg | Freq: Once | INTRAMUSCULAR | Status: AC
  Administered 2023-06-08: 40 mg via INTRA_ARTICULAR

## 2023-06-08 NOTE — Addendum Note (Signed)
Addended by: Michaele Offer on: 06/08/2023 04:33 PM   Modules accepted: Orders

## 2023-06-08 NOTE — Progress Notes (Signed)
Subjective:    Patient ID: Drew Kent, male    DOB: October 20, 1965, 57 y.o.   MRN: [id number]  HPI He has history of having a large cystic lesion behind the knee for years.  He has been told it is a Baker's Cyst.  He had it aspirated many years ago but it recurred.  It is bothering him more now.  He has no trauma, no redness, no fever or chills.  He was seen at [facility] and referred here.  I have reviewed the notes.   Review of Systems  Constitutional:  Positive for activity change.  Musculoskeletal:  Positive for arthralgias and joint swelling.  All other systems reviewed and are negative. For Review of Systems, all other systems reviewed and are negative.  The following is a summary of the past history medically, past history surgically, known current medicines, social history and family history.  This information is gathered electronically by the computer from prior information and documentation.  I review this each visit and have found including this information at this point in the chart is beneficial and informative.   Past Medical History:  Diagnosis Date   Arthritis    Chronic hepatitis C (HCC)     Past Surgical History:  Procedure Laterality Date   COLONOSCOPY WITH PROPOFOL N/A 03/12/2019   Procedure: COLONOSCOPY WITH PROPOFOL;  Surgeon: West Bali, MD;  Location: [facility];  Service: Endoscopy;  Laterality: N/A;  2:15pm   HERNIA REPAIR     POLYPECTOMY  03/12/2019   Procedure: POLYPECTOMY;  Surgeon: West Bali, MD;  Location: [facility];  Service: Endoscopy;;  colon   WRIST ARTHROSCOPY      Current Outpatient Medications on File Prior to Visit  Medication Sig Dispense Refill   acetaminophen (TYLENOL) 325 MG tablet Take 2 tablets every 6 hours by oral route for 30 days.     Magnesium Oxide -Mg Supplement 200 MG TABS take 1-2 tablets by mouth daily as needed     loratadine (CLARITIN) 10 MG tablet Take 10 mg by mouth daily. (Patient not taking: Reported on  06/08/2023)     NON FORMULARY Revitaderm cream for psoriasis  As directed (Patient not taking: Reported on 06/08/2023)     OVER THE COUNTER MEDICATION Triderma Cream for psoriasis as directed (Patient not taking: Reported on 06/08/2023)     Sofosbuvir-Velpatasvir (EPCLUSA PO) Take by mouth. (Patient not taking: Reported on 06/08/2023)     No current facility-administered medications on file prior to visit.    Social History   Socioeconomic History   Marital status: Married    Spouse name: Not on file   Number of children: Not on file   Years of education: Not on file   Highest education level: Not on file  Occupational History   Not on file  Tobacco Use   Smoking status: Every Day    Current packs/day: 0.25    Types: Cigarettes, E-cigarettes   Smokeless tobacco: Never  Vaping Use   Vaping status: Never Used  Substance and Sexual Activity   Alcohol use: Not Currently    Comment: history of alcoholism (last ETOH 08/17/2018)   Drug use: Not Currently    Types: Cocaine   Sexual activity: Not on file  Other Topics Concern   Not on file  Social History Narrative   Not on file   Social Determinants of Health   Financial Resource Strain: Not on file  Food Insecurity: Not on file  Transportation  Needs: Not on file  Physical Activity: Not on file  Stress: Not on file  Social Connections: Not on file  Intimate Partner Violence: Not on file    Family History  Problem Relation Age of Onset   Heart failure Mother    Heart disease Mother    Cancer Other    Diabetes Other    Heart failure Other    Colon cancer Neg Hx    Colon polyps Neg Hx     BP (!) 144/80   Pulse 66   Ht 6\' 2"  (1.88 m)   Wt 250 lb (113.4 kg)   BMI 32.10 kg/m   Body mass index is 32.1 kg/m.      Objective:   Physical Exam Vitals and nursing note reviewed. Exam conducted with a chaperone present.  Constitutional:      Appearance: He is well-developed.  HENT:     Head: Normocephalic and  atraumatic.  Eyes:     Conjunctiva/sclera: Conjunctivae normal.     Pupils: Pupils are equal, round, and reactive to light.  Cardiovascular:     Rate and Rhythm: Normal rate and regular rhythm.  Pulmonary:     Effort: Pulmonary effort is normal.  Abdominal:     Palpations: Abdomen is soft.  Musculoskeletal:     Cervical back: Normal range of motion and neck supple.       Legs:  Skin:    General: Skin is warm and dry.  Neurological:     Mental Status: He is alert and oriented to person, place, and time.     Cranial Nerves: No cranial nerve deficit.     Motor: No abnormal muscle tone.     Coordination: Coordination normal.     Deep Tendon Reflexes: Reflexes are normal and symmetric. Reflexes normal.  Psychiatric:        Behavior: Behavior normal.        Thought Content: Thought content normal.        Judgment: Judgment normal.   X-rays were done of the left knee, reported separately.        Assessment & Plan:   Encounter Diagnoses  Name Primary?   Chronic pain of left knee Yes   Baker's cyst, left    Procedure note: After permission from the patient and prep of the posterior left knee over the cystic lesion, I aspirated 20 cc of thick synovial fluid from the knee and instilled 1% Xylocaine and 1 cc DepoMedrol 40 by sterile technique tolerated well.  I told him the fluid my recur, even by tomorrow.  I will see him in two weeks.  He may need to consider surgery or let it be.  Call if any problem.  Precautions discussed.  Electronically Signed Darreld Mclean, MD 10/2/20249:50 AM

## 2023-06-29 ENCOUNTER — Encounter: Payer: Self-pay | Admitting: Orthopaedic Surgery

## 2023-06-29 ENCOUNTER — Ambulatory Visit: Payer: Medicaid Other | Admitting: Orthopaedic Surgery

## 2023-06-29 VITALS — BP 135/79 | HR 71 | Ht 74.0 in | Wt 252.0 lb

## 2023-06-29 DIAGNOSIS — M25562 Pain in left knee: Secondary | ICD-10-CM

## 2023-06-29 DIAGNOSIS — M7122 Synovial cyst of popliteal space [Baker], left knee: Secondary | ICD-10-CM | POA: Diagnosis not present

## 2023-06-29 DIAGNOSIS — G8929 Other chronic pain: Secondary | ICD-10-CM

## 2023-06-29 NOTE — Progress Notes (Signed)
It came back.  His swelling of the posterior left knee has returned.  He has swelling posterior and medially.  The fluid slowly recurred.  He would like to have it excised.  The left knee has full motion and swelling posterior medially over the medial hamstring area.  It is not red.  NV intact.  Encounter Diagnoses  Name Primary?   Chronic pain of left knee Yes   Baker's cyst, left    I will have him see Dr. Romeo Apple for excision of the lesion.  Call if any problem.  Precautions discussed.  Electronically Signed Darreld Mclean, MD 10/23/20248:14 AM

## 2023-07-04 ENCOUNTER — Encounter: Payer: Self-pay | Admitting: Orthopedic Surgery

## 2023-07-04 ENCOUNTER — Ambulatory Visit: Payer: Medicaid Other | Admitting: Orthopedic Surgery

## 2023-07-04 VITALS — BP 148/84 | HR 74 | Ht 74.0 in | Wt 252.0 lb

## 2023-07-04 DIAGNOSIS — M6749 Ganglion, multiple sites: Secondary | ICD-10-CM

## 2023-07-04 DIAGNOSIS — M6789 Other specified disorders of synovium and tendon, multiple sites: Secondary | ICD-10-CM | POA: Diagnosis not present

## 2023-07-04 DIAGNOSIS — M7139 Other bursal cyst, multiple sites: Secondary | ICD-10-CM

## 2023-07-04 NOTE — Progress Notes (Signed)
Patient: Drew Kent           Date of Birth: [birth year]           MRN: [id number] Visit Date: 07/04/2023 Requested by: Kathalene Frames [facility] [address] [place],  [place] [address] PCP: Ponciano Ort, [facility]   Chief Complaint  Patient presents with   Knee Pain    Left    Encounter Diagnosis  Name Primary?   Ganglion and cyst of synovium, tendon and bursa Yes    Plan:  The patient would like the mass removed  We did an ultrasound in the office it looks to be a subcutaneous mass with no connections to the knee joint  I doubt this will control the pain he is having from his hip to his knee he wishes to proceed in any event  The procedure has been fully reviewed with the patient; The risks and benefits of surgery have been discussed and explained and understood. Alternative treatment has also been reviewed, questions were encouraged and answered. The postoperative plan is also been reviewed.   Planned procedure excision of ganglion cyst posterior aspect left knee  Chief Complaint  Patient presents with   Knee Pain    Left     57 year old male seen by Dr. Hilda Lias referred to me after aspiration of a mass on the back of his left leg revealed synovial fluid.  Patient is expecting this to be removed  His pain is from his hip down to his foot and focuses and concentrate on his knee when it is really bad.  No history of trauma  Cyst has been there for 10 years.  Size varies but recently has gotten a little bit bigger   Body mass index is 32.35 kg/m.   Problem list, medical hx, medications and allergies reviewed   Review of Systems  Constitutional:  Negative for fever.  Respiratory:  Negative for shortness of breath.   Cardiovascular:  Negative for chest pain.  Skin: Negative.   Neurological:  Negative for tingling and sensory change.    No Known Allergies  BP (!) 148/84   Pulse 74   Ht 6\' 2"  (1.88 m)   Wt 252 lb (114.3 kg)   BMI 32.35 kg/m    Physical  exam: Physical Exam Vitals and nursing note reviewed.  Constitutional:      Appearance: Normal appearance.  HENT:     Head: Normocephalic and atraumatic.  Eyes:     General: No scleral icterus.       Right eye: No discharge.        Left eye: No discharge.     Extraocular Movements: Extraocular movements intact.     Conjunctiva/sclera: Conjunctivae normal.     Pupils: Pupils are equal, round, and reactive to light.  Cardiovascular:     Rate and Rhythm: Normal rate.     Pulses: Normal pulses.  Skin:    General: Skin is warm and dry.     Capillary Refill: Capillary refill takes less than 2 seconds.  Neurological:     General: No focal deficit present.     Mental Status: He is alert and oriented to person, place, and time.  Psychiatric:        Mood and Affect: Mood normal.        Behavior: Behavior normal.        Thought Content: Thought content normal.        Judgment: Judgment normal.    Right Knee Exam  Muscle Strength  The patient has normal right knee strength.  Tenderness  The patient is experiencing no tenderness.   Range of Motion  Extension:  normal  Flexion:  normal    Left Knee Exam   Muscle Strength  The patient has normal left knee strength.  Tenderness  The patient is experiencing no tenderness.   Range of Motion  Extension:  normal  Flexion:  normal   Tests  Drawer:  Anterior - negative     Posterior - negative  Other  Erythema: absent Scars: absent Sensation: normal Pulse: present Swelling: none  Comments:  Eczema left knee anterior aspect  He has a 5 x 3 cm mass subcutaneous tissue left knee posterior aspect    MSK:  5 x 3 cm posterior subcutaneous mass left  Data reviewed:   Image(s) reviewed with personal interpretation:  Plain films were done by Dr. Hilda Lias narrowing medial compartment follow-up under  Assessment and plan:  Encounter Diagnosis  Name Primary?   Ganglion and cyst of synovium, tendon and bursa Yes        No orders of the defined types were placed in this encounter.   Procedures:   Diagnostic ultrasound performed in the office to evaluate mass posterior aspect of left knee  Findings indicate subcutaneous location with no extension into the joint

## 2023-07-04 NOTE — Patient Instructions (Signed)
 Your surgery will be at [facility] by Dr Romeo Apple  The hospital will contact you with a preoperative appointment to discuss Anesthesia.  Please arrive on time or 15 minutes early for the preoperative appointment, they have a very tight schedule if you are late or do not come in your surgery will be cancelled.  The phone number is [phone]. Please bring your medications with you for the appointment. They will tell you the arrival time and medication instructions when you have your preoperative evaluation. Do not wear nail polish the day of your surgery and if you take Phentermine you need to stop this medication ONE WEEK prior to your surgery. If you take Docia Barrier, Jardiance, or Steglatro) - Hold 72 hours before the procedure.  If you take Ozempic,  Mounjaro, Bydureon or Trulicity do not take for 8 days before your surgery. If you take Victoza, Rybelsis, Saxenda or Adlyxi stop 24 hours before the procedure.  Please arrive at the hospital 2 hours before procedure if scheduled at 9:30 or later in the day or at the time the nurse tells you at your preoperative visit.   If you have my chart do not use the time given in my chart use the time given to you by the nurse during your preoperative visit.   Your surgery  time may change. Please be available for phone calls the day of your surgery and the day before. The Short Stay department may need to discuss changes about your surgery time. Not reaching the you could lead to procedure delays and possible cancellation.  You must have a ride home and someone to stay with you for 24 to 48 hours. The person taking you home will receive and sign for the your discharge instructions.  Please be prepared to give your support person's name and telephone number to Central Registration. Dr Romeo Apple will need that name and phone number post procedure.

## 2023-07-04 NOTE — Addendum Note (Signed)
Addended byCaffie Damme on: 07/04/2023 12:10 PM   Modules accepted: Orders

## 2023-07-14 NOTE — Patient Instructions (Signed)
Drew Kent  07/14/2023     @PREFPERIOPPHARMACY @   Your procedure is scheduled on  07/19/2023.   Report to [name] at  0830 A.M.   Call this number if you have problems the morning of surgery:  [phone]  If you experience any cold or flu symptoms such as cough, fever, chills, shortness of breath, etc. between now and your scheduled surgery, please notify us at the above number.   Remember:  Do not eat or drink after midnight.   You may drink clear liquids until 0630 am on 07/19/2023.    Clear liquids allowed are:                    Water, Juice (No red color; non-citric and without pulp; diabetics please choose diet or no sugar options), Carbonated beverages (diabetics please choose diet or no sugar options), Clear Tea (No creamer, milk, or cream, including half & half and powdered creamer), Black Coffee Only (No creamer, milk or cream, including half & half and powdered creamer), and Clear Sports drink (No red color; diabetics please choose diet or no sugar options)    Take these medicines the morning of surgery with A SIP OF WATER                                                        None.    Do not wear jewelry, make-up or nail polish, including gel polish,  artificial nails, or any other type of covering on natural nails (fingers and  toes).  Do not wear lotions, powders, or perfumes, or deodorant.  Do not shave 48 hours prior to surgery.  Men may shave face and neck.  Do not bring valuables to the hospital.  [facility] is not responsible for any belongings or valuables.  Contacts, dentures or bridgework may not be worn into surgery.  Leave your suitcase in the car.  After surgery it may be brought to your room.  For patients admitted to the hospital, discharge time will be determined by your treatment team.  Patients discharged the day of surgery will not be allowed to drive home and must have someone with them for 24 hours.    Special instructions:    DO NOT smoke tobacco or vape for 24 hors before your procedure.  Please read over the following fact sheets that you were given. Coughing and Deep Breathing, Surgical Site Infection Prevention, Anesthesia Post-op Instructions, and Care and Recovery After Surgery        Ganglion Cyst Removal, Care After This sheet gives you information about how to care for yourself after your procedure. Your health care provider may also give you more specific instructions. If you have problems or questions, contact your health care provider. What can I expect after the procedure? After the procedure, it is common to have: Soreness. Swelling. Stiffness. A splint or brace. Follow these instructions at home: Medicines Take over-the-counter and prescription medicines only as told by your health care provider. Ask your health care provider if the medicine prescribed to you requires you to avoid driving or using machinery. Incision care  Follow instructions from your health care provider about how to take care of your incision or incisions. Make sure you: Wash your hands with soap and water  for at least 20 seconds before and after you change your bandage (dressing). If soap and water are not available, use hand sanitizer. Change and remove your dressing as told by your health care provider. Leave stitches (sutures), skin glue, or adhesive strips in place. These skin closures may need to stay in place for 2 weeks or longer. If adhesive strip edges start to loosen and curl up, you may trim the loose edges. Do not remove adhesive strips completely unless your health care provider tells you to do that. Check your incision area every day for signs of infection. Check for: Redness, swelling, or pain. Fluid or blood. Warmth. Pus or a bad smell. Do not take baths, swim, or use a hot tub until your health care provider approves. If you have a splint or brace: Wear it as told by your health care provider. Remove  it only as told by your health care provider. You may need to wear the splint or brace for several days. Loosen the splint or brace if your fingers (or toes) tingle, become numb, or turn cold and blue. Keep the splint or brace clean. If the splint or brace is not waterproof: Do not let it get wet. Ask if you can remove it for bathing. If not, cover it with a watertight covering when you take a bath or shower. Managing pain, stiffness, and swelling  If directed, put ice on the incision area. To do this: If you have a removable splint or brace, remove it as told by your health care provider. Put ice in a plastic bag. Place a towel between your skin and the bag or between the splint or brace and the bag. Leave the ice on for 20 minutes, 2-3 times a day. Move your fingers (or toes) often to reduce stiffness and swelling. Raise (elevate) the affected area above the level of your heart while you are sitting or lying down. Activity Return to your normal activities as told by your health care provider. Ask your health care provider what activities are safe for you. Avoid activities that cause pain. Ask your health care provider when it is safe to drive and when you should start doing movement exercises. General instructions Do not use any products that contain nicotine or tobacco, such as cigarettes, e-cigarettes, and chewing tobacco. These can delay healing. If you need help quitting, ask your health care provider. Keep all follow-up visits as told by your health care provider. This is important. Contact a health care provider if: Medicine is not relieving your pain. You have stiffness or swelling that gets worse. Your splint or brace is not fitting correctly, causing your fingers (or toes) to tingle, become numb, or turn cold and blue. You have any signs of infection at your incision site. You have a fever. Summary After the procedure, you can expect to have some soreness, stiffness, and  swelling. You may need to wear a splint or brace for a few days. Follow instructions for changing your dressing and checking your incision area for signs of infection. Contact your health care provider if you have a fever, signs of infection, stiffness or swelling that gets worse, or continuing pain, tingling, or numbness. This information is not intended to replace advice given to you by your health care provider. Make sure you discuss any questions you have with your health care provider. Document Revised: 11/16/2019 Document Reviewed: 11/16/2019 Elsevier Patient Education  2024 [employer]. General Anesthesia, Adult, Care After The following information offers  guidance on how to care for yourself after your procedure. Your health care provider may also give you more specific instructions. If you have problems or questions, contact your health care provider. What can I expect after the procedure? After the procedure, it is common for people to: Have pain or discomfort at the IV site. Have nausea or vomiting. Have a sore throat or hoarseness. Have trouble concentrating. Feel cold or chills. Feel weak, sleepy, or tired (fatigue). Have soreness and body aches. These can affect parts of the body that were not involved in surgery. Follow these instructions at home: For the time period you were told by your health care provider:  Rest. Do not participate in activities where you could fall or become injured. Do not drive or use machinery. Do not drink alcohol. Do not take sleeping pills or medicines that cause drowsiness. Do not make important decisions or sign legal documents. Do not take care of children on your own. General instructions Drink enough fluid to keep your urine pale yellow. If you have sleep apnea, surgery and certain medicines can increase your risk for breathing problems. Follow instructions from your health care provider about wearing your sleep device: Anytime you are  sleeping, including during daytime naps. While taking prescription pain medicines, sleeping medicines, or medicines that make you drowsy. Return to your normal activities as told by your health care provider. Ask your health care provider what activities are safe for you. Take over-the-counter and prescription medicines only as told by your health care provider. Do not use any products that contain nicotine or tobacco. These products include cigarettes, chewing tobacco, and vaping devices, such as e-cigarettes. These can delay incision healing after surgery. If you need help quitting, ask your health care provider. Contact a health care provider if: You have nausea or vomiting that does not get better with medicine. You vomit every time you eat or drink. You have pain that does not get better with medicine. You cannot urinate or have bloody urine. You develop a skin rash. You have a fever. Get help right away if: You have trouble breathing. You have chest pain. You vomit blood. These symptoms may be an emergency. Get help right away. Call 911. Do not wait to see if the symptoms will go away. Do not drive yourself to the hospital. Summary After the procedure, it is common to have a sore throat, hoarseness, nausea, vomiting, or to feel weak, sleepy, or fatigue. For the time period you were told by your health care provider, do not drive or use machinery. Get help right away if you have difficulty breathing, have chest pain, or vomit blood. These symptoms may be an emergency. This information is not intended to replace advice given to you by your health care provider. Make sure you discuss any questions you have with your health care provider. Document Revised: 11/20/2021 Document Reviewed: 11/20/2021 Elsevier Patient Education  2024 Else[employer] Inc. How to Use Chlorhexidine Before Surgery Chlorhexidine gluconate (CHG) is a germ-killing (antiseptic) solution that is used to clean the skin. It  can get rid of the bacteria that normally live on the skin and can keep them away for about 24 hours. To clean your skin with CHG, you may be given: A CHG solution to use in the shower or as part of a sponge bath. A prepackaged cloth that contains CHG. Cleaning your skin with CHG may help lower the risk for infection: While you are staying in the intensive care unit of  the hospital. If you have a vascular access, such as a central line, to provide short-term or long-term access to your veins. If you have a catheter to drain urine from your bladder. If you are on a ventilator. A ventilator is a machine that helps you breathe by moving air in and out of your lungs. After surgery. What are the risks? Risks of using CHG include: A skin reaction. Hearing loss, if CHG gets in your ears and you have a perforated eardrum. Eye injury, if CHG gets in your eyes and is not rinsed out. The CHG product catching fire. Make sure that you avoid smoking and flames after applying CHG to your skin. Do not use CHG: If you have a chlorhexidine allergy or have previously reacted to chlorhexidine. On babies younger than 57 months of age. How to use CHG solution Use CHG only as told by your health care provider, and follow the instructions on the label. Use the full amount of CHG as directed. Usually, this is one bottle. During a shower Follow these steps when using CHG solution during a shower (unless your health care provider gives you different instructions): Start the shower. Use your normal soap and shampoo to wash your face and hair. Turn off the shower or move out of the shower stream. Pour the CHG onto a clean washcloth. Do not use any type of brush or rough-edged sponge. Starting at your neck, lather your body down to your toes. Make sure you follow these instructions: If you will be having surgery, pay special attention to the part of your body where you will be having surgery. Scrub this area for at  least 1 minute. Do not use CHG on your head or face. If the solution gets into your ears or eyes, rinse them well with water. Avoid your genital area. Avoid any areas of skin that have broken skin, cuts, or scrapes. Scrub your back and under your arms. Make sure to wash skin folds. Let the lather sit on your skin for 1-2 minutes or as long as told by your health care provider. Thoroughly rinse your entire body in the shower. Make sure that all body creases and crevices are rinsed well. Dry off with a clean towel. Do not put any substances on your body afterward--such as powder, lotion, or perfume--unless you are told to do so by your health care provider. Only use lotions that are recommended by the manufacturer. Put on clean clothes or pajamas. If it is the night before your surgery, sleep in clean sheets.  During a sponge bath Follow these steps when using CHG solution during a sponge bath (unless your health care provider gives you different instructions): Use your normal soap and shampoo to wash your face and hair. Pour the CHG onto a clean washcloth. Starting at your neck, lather your body down to your toes. Make sure you follow these instructions: If you will be having surgery, pay special attention to the part of your body where you will be having surgery. Scrub this area for at least 1 minute. Do not use CHG on your head or face. If the solution gets into your ears or eyes, rinse them well with water. Avoid your genital area. Avoid any areas of skin that have broken skin, cuts, or scrapes. Scrub your back and under your arms. Make sure to wash skin folds. Let the lather sit on your skin for 1-2 minutes or as long as told by your health care provider. Using  a different clean, wet washcloth, thoroughly rinse your entire body. Make sure that all body creases and crevices are rinsed well. Dry off with a clean towel. Do not put any substances on your body afterward--such as powder, lotion,  or perfume--unless you are told to do so by your health care provider. Only use lotions that are recommended by the manufacturer. Put on clean clothes or pajamas. If it is the night before your surgery, sleep in clean sheets. How to use CHG prepackaged cloths Only use CHG cloths as told by your health care provider, and follow the instructions on the label. Use the CHG cloth on clean, dry skin. Do not use the CHG cloth on your head or face unless your health care provider tells you to. When washing with the CHG cloth: Avoid your genital area. Avoid any areas of skin that have broken skin, cuts, or scrapes. Before surgery Follow these steps when using a CHG cloth to clean before surgery (unless your health care provider gives you different instructions): Using the CHG cloth, vigorously scrub the part of your body where you will be having surgery. Scrub using a back-and-forth motion for 3 minutes. The area on your body should be completely wet with CHG when you are done scrubbing. Do not rinse. Discard the cloth and let the area air-dry. Do not put any substances on the area afterward, such as powder, lotion, or perfume. Put on clean clothes or pajamas. If it is the night before your surgery, sleep in clean sheets.  For general bathing Follow these steps when using CHG cloths for general bathing (unless your health care provider gives you different instructions). Use a separate CHG cloth for each area of your body. Make sure you wash between any folds of skin and between your fingers and toes. Wash your body in the following order, switching to a new cloth after each step: The front of your neck, shoulders, and chest. Both of your arms, under your arms, and your hands. Your stomach and groin area, avoiding the genitals. Your right leg and foot. Your left leg and foot. The back of your neck, your back, and your buttocks. Do not rinse. Discard the cloth and let the area air-dry. Do not put any  substances on your body afterward--such as powder, lotion, or perfume--unless you are told to do so by your health care provider. Only use lotions that are recommended by the manufacturer. Put on clean clothes or pajamas. Contact a health care provider if: Your skin gets irritated after scrubbing. You have questions about using your solution or cloth. You swallow any chlorhexidine. Call your local poison control center (862-869-2002 in the [place] Get help right away if: Your eyes itch badly, or they become very red or swollen. Your skin itches badly and is red or swollen. Your hearing changes. You have trouble seeing. You have swelling or tingling in your mouth or throat. You have trouble breathing. These symptoms may represent a serious problem that is an emergency. Do not wait to see if the symptoms will go away. Get medical help right away. Call your local emergency services (911 in the [place] Do not drive yourself to the hospital. Summary Chlorhexidine gluconate (CHG) is a germ-killing (antiseptic) solution that is used to clean the skin. Cleaning your skin with CHG may help to lower your risk for infection. You may be given CHG to use for bathing. It may be in a bottle or in a prepackaged cloth to use on your  skin. Carefully follow your health care provider's instructions and the instructions on the product label. Do not use CHG if you have a chlorhexidine allergy. Contact your health care provider if your skin gets irritated after scrubbing. This information is not intended to replace advice given to you by your health care provider. Make sure you discuss any questions you have with your health care provider. Document Revised: 12/21/2021 Document Reviewed: 11/03/2020 Elsevier Patient Education  2023 [employer].

## 2023-07-15 ENCOUNTER — Encounter (HOSPITAL_COMMUNITY)
Admission: RE | Admit: 2023-07-15 | Discharge: 2023-07-15 | Disposition: A | Discharge status: Home / Self Care | Payer: MEDICAID | Source: Ambulatory Visit | Attending: Orthopedic Surgery | Admitting: Orthopedic Surgery

## 2023-07-15 ENCOUNTER — Encounter (HOSPITAL_COMMUNITY): Payer: Self-pay

## 2023-07-15 VITALS — BP 140/86 | HR 75 | Temp 97.8°F | Resp 18 | Ht 74.0 in | Wt 252.0 lb

## 2023-07-15 DIAGNOSIS — Z01818 Encounter for other preprocedural examination: Secondary | ICD-10-CM | POA: Insufficient documentation

## 2023-07-15 DIAGNOSIS — M7139 Other bursal cyst, multiple sites: Secondary | ICD-10-CM | POA: Insufficient documentation

## 2023-07-15 DIAGNOSIS — M6749 Ganglion, multiple sites: Secondary | ICD-10-CM | POA: Diagnosis not present

## 2023-07-15 DIAGNOSIS — Z8679 Personal history of other diseases of the circulatory system: Secondary | ICD-10-CM | POA: Diagnosis not present

## 2023-07-15 DIAGNOSIS — M6789 Other specified disorders of synovium and tendon, multiple sites: Secondary | ICD-10-CM | POA: Insufficient documentation

## 2023-07-15 DIAGNOSIS — F17200 Nicotine dependence, unspecified, uncomplicated: Secondary | ICD-10-CM | POA: Insufficient documentation

## 2023-07-15 LAB — BASIC METABOLIC PANEL
Anion gap: 7 (ref 5–15)
BUN: 24 mg/dL — ABNORMAL HIGH (ref 6–20)
CO2: 21 mmol/L — ABNORMAL LOW (ref 22–32)
Calcium: 9 mg/dL (ref 8.9–10.3)
Chloride: 108 mmol/L (ref 98–111)
Creatinine, Ser: 0.85 mg/dL (ref 0.61–1.24)
GFR, Estimated: >60 mL/min (ref >60)
Glucose, Bld: 93 mg/dL (ref 70–99)
Potassium: 4.2 mmol/L (ref 3.5–5.1)
Sodium: 136 mmol/L (ref 135–145)

## 2023-07-15 LAB — CBC WITH DIFFERENTIAL/PLATELET
Abs Immature Granulocytes: 0.01 10*3/uL (ref 0.00–0.07)
Basophils Absolute: 0.1 10*3/uL (ref 0.0–0.1)
Basophils Relative: 1 %
Eosinophils Absolute: 0.1 10*3/uL (ref 0.0–0.5)
Eosinophils Relative: 2 %
HCT: 43.4 % (ref 39.0–52.0)
Hemoglobin: 14.6 g/dL (ref 13.0–17.0)
Immature Granulocytes: 0 %
Lymphocytes Relative: 28 %
Lymphs Abs: 1.6 10*3/uL (ref 0.7–4.0)
MCH: 32.2 pg (ref 26.0–34.0)
MCHC: 33.6 g/dL (ref 30.0–36.0)
MCV: 95.6 fL (ref 80.0–100.0)
Monocytes Absolute: 0.5 10*3/uL (ref 0.1–1.0)
Monocytes Relative: 8 %
Neutro Abs: 3.5 10*3/uL (ref 1.7–7.7)
Neutrophils Relative %: 61 %
Platelets: 216 10*3/uL (ref 150–400)
RBC: 4.54 MIL/uL (ref 4.22–5.81)
RDW: 12.4 % (ref 11.5–15.5)
WBC: 5.8 10*3/uL (ref 4.0–10.5)
nRBC: 0 % (ref 0.0–0.2)

## 2023-07-18 NOTE — H&P (Signed)
Expand All Collapse All  Patient: Drew Kent                                    Date of Birth: April 03, 1966                                                 MRN: [id number] Visit Date: 07/04/2023 Requested by: Kathalene Frames [facility] [address] [place],  [place] [address] PCP: Ponciano Ort, [facility]         Chief Complaint  Patient presents with   Knee Pain      Left         Encounter Diagnosis  Name Primary?   Ganglion and cyst of synovium, tendon and bursa Yes      Plan:  The patient would like the mass removed   We did an ultrasound in the office it looks to be a subcutaneous mass with no connections to the knee joint   I doubt this will control the pain he is having from his hip to his knee he wishes to proceed in any event   The procedure has been fully reviewed with the patient; The risks and benefits of surgery have been discussed and explained and understood. Alternative treatment has also been reviewed, questions were encouraged and answered. The postoperative plan is also been reviewed.     Planned procedure excision of ganglion cyst posterior aspect left knee       Chief Complaint  Patient presents with   Knee Pain      Left       57 year old male seen by Dr. Hilda Lias referred to me after aspiration of a mass on the back of his left leg revealed synovial fluid.  Patient is expecting this to be removed   His pain is from his hip down to his foot and focuses and concentrate on his knee when it is really bad.  No history of trauma   Cyst has been there for 10 years.  Size varies but recently has gotten a little bit bigger     Body mass index is 32.35 kg/m.     Problem list, medical hx, medications and allergies reviewed    Review of Systems  Constitutional:  Negative for fever.  Respiratory:  Negative for shortness of breath.   Cardiovascular:  Negative for chest pain.  Skin: Negative.   Neurological:  Negative for tingling and sensory change.       Allergies  No Known Allergies     BP (!) 148/84   Pulse 74   Ht 6\' 2"  (1.88 m)   Wt 252 lb (114.3 kg)   BMI 32.35 kg/m      Physical exam: Physical Exam Vitals and nursing note reviewed.  Constitutional:      Appearance: Normal appearance.  HENT:     Head: Normocephalic and atraumatic.  Eyes:     General: No scleral icterus.       Right eye: No discharge.        Left eye: No discharge.     Extraocular Movements: Extraocular movements intact.     Conjunctiva/sclera: Conjunctivae normal.     Pupils: Pupils are equal, round, and reactive to light.  Cardiovascular:     Rate and  Rhythm: Normal rate.     Pulses: Normal pulses.  Skin:    General: Skin is warm and dry.     Capillary Refill: Capillary refill takes less than 2 seconds.  Neurological:     General: No focal deficit present.     Mental Status: He is alert and oriented to person, place, and time.  Psychiatric:        Mood and Affect: Mood normal.        Behavior: Behavior normal.        Thought Content: Thought content normal.        Judgment: Judgment normal.      Right Knee Exam    Muscle Strength  The patient has normal right knee strength.   Tenderness  The patient is experiencing no tenderness.    Range of Motion  Extension:  normal  Flexion:  normal      Left Knee Exam    Muscle Strength  The patient has normal left knee strength.   Tenderness  The patient is experiencing no tenderness.    Range of Motion  Extension:  normal  Flexion:  normal    Tests  Drawer:  Anterior - negative     Posterior - negative   Other  Erythema: absent Scars: absent Sensation: normal Pulse: present Swelling: none   Comments:  Eczema left knee anterior aspect   He has a 5 x 3 cm mass subcutaneous tissue left knee posterior aspect       MSK:   5 x 3 cm posterior subcutaneous mass left   Data reviewed:    Image(s) reviewed with personal interpretation:  Plain films were done by Dr. Hilda Lias  narrowing medial compartment follow-up under   Assessment and plan:       Encounter Diagnosis  Name Primary?   Ganglion and cyst of synovium, tendon and bursa Yes            No orders of the defined types were placed in this encounter.     Procedures:    Diagnostic ultrasound performed in the office to evaluate mass posterior aspect of left knee   Findings indicate subcutaneous location with no extension into the joint

## 2023-07-19 ENCOUNTER — Ambulatory Visit (HOSPITAL_BASED_OUTPATIENT_CLINIC_OR_DEPARTMENT_OTHER): Payer: MEDICAID | Admitting: Certified Registered"

## 2023-07-19 ENCOUNTER — Encounter (HOSPITAL_COMMUNITY)
Admission: RE | Disposition: A | Discharge status: Home / Self Care | Payer: Self-pay | Source: Home / Self Care | Attending: Orthopedic Surgery

## 2023-07-19 ENCOUNTER — Ambulatory Visit (HOSPITAL_COMMUNITY)
Admission: RE | Admit: 2023-07-19 | Discharge: 2023-07-19 | Disposition: A | Discharge status: Home / Self Care | Payer: MEDICAID | Attending: Orthopedic Surgery | Admitting: Orthopedic Surgery

## 2023-07-19 ENCOUNTER — Ambulatory Visit (HOSPITAL_COMMUNITY): Payer: MEDICAID | Admitting: Certified Registered"

## 2023-07-19 ENCOUNTER — Other Ambulatory Visit: Payer: Self-pay

## 2023-07-19 ENCOUNTER — Encounter (HOSPITAL_COMMUNITY): Payer: Self-pay | Admitting: Orthopedic Surgery

## 2023-07-19 DIAGNOSIS — M67462 Ganglion, left knee: Secondary | ICD-10-CM | POA: Diagnosis present

## 2023-07-19 DIAGNOSIS — M7122 Synovial cyst of popliteal space [Baker], left knee: Secondary | ICD-10-CM

## 2023-07-19 DIAGNOSIS — F17200 Nicotine dependence, unspecified, uncomplicated: Secondary | ICD-10-CM | POA: Insufficient documentation

## 2023-07-19 DIAGNOSIS — Z8619 Personal history of other infectious and parasitic diseases: Secondary | ICD-10-CM | POA: Diagnosis not present

## 2023-07-19 DIAGNOSIS — M1990 Unspecified osteoarthritis, unspecified site: Secondary | ICD-10-CM | POA: Insufficient documentation

## 2023-07-19 HISTORY — PX: EXCISION MASS LOWER EXTREMETIES: SHX6705

## 2023-07-19 SURGERY — EXCISION MASS LOWER EXTREMITIES
Anesthesia: General | Site: Knee | Laterality: Left

## 2023-07-19 MED ORDER — LIDOCAINE HCL (PF) 1 % IJ SOLN
INTRAMUSCULAR | Status: AC
  Filled 2023-07-19: qty 30

## 2023-07-19 MED ORDER — BUPIVACAINE HCL (PF) 0.5 % IJ SOLN
INTRAMUSCULAR | Status: AC
  Filled 2023-07-19: qty 30

## 2023-07-19 MED ORDER — 0.9 % SODIUM CHLORIDE (POUR BTL) OPTIME
TOPICAL | Status: DC | PRN
  Administered 2023-07-19: 1000 mL

## 2023-07-19 MED ORDER — ORAL CARE MOUTH RINSE: 15.0000 mL | Freq: Once | OROMUCOSAL | Status: AC

## 2023-07-19 MED ORDER — GLYCOPYRROLATE PF 0.2 MG/ML IJ SOSY
PREFILLED_SYRINGE | INTRAMUSCULAR | Status: AC
  Filled 2023-07-19: qty 1

## 2023-07-19 MED ORDER — BUPIVACAINE HCL (PF) 0.5 % IJ SOLN
INTRAMUSCULAR | Status: DC | PRN
  Administered 2023-07-19: 30 mL

## 2023-07-19 MED ORDER — ROCURONIUM BROMIDE 10 MG/ML (PF) SYRINGE
PREFILLED_SYRINGE | INTRAVENOUS | Status: AC
Start: 2023-07-19 — End: ?
  Filled 2023-07-19: qty 10

## 2023-07-19 MED ORDER — MIDAZOLAM HCL 2 MG/2ML IJ SOLN
INTRAMUSCULAR | Status: DC | PRN
  Administered 2023-07-19: 2 mg via INTRAVENOUS

## 2023-07-19 MED ORDER — DEXAMETHASONE SODIUM PHOSPHATE 10 MG/ML IJ SOLN
INTRAMUSCULAR | Status: AC
  Filled 2023-07-19: qty 1

## 2023-07-19 MED ORDER — FENTANYL CITRATE (PF) 250 MCG/5ML IJ SOLN
INTRAMUSCULAR | Status: DC | PRN
  Administered 2023-07-19: 100 ug via INTRAVENOUS

## 2023-07-19 MED ORDER — CEFAZOLIN SODIUM-DEXTROSE 2-4 GM/100ML-% IV SOLN
2.0000 g | INTRAVENOUS | Status: AC
  Administered 2023-07-19: 2 g via INTRAVENOUS

## 2023-07-19 MED ORDER — ACETAMINOPHEN-CODEINE 300-30 MG PO TABS: 1.0000 | ORAL_TABLET | Freq: Four times a day (QID) | ORAL | 0 refills | Status: AC | PRN

## 2023-07-19 MED ORDER — PROPOFOL 10 MG/ML IV BOLUS
INTRAVENOUS | Status: DC | PRN
  Administered 2023-07-19: 200 mg via INTRAVENOUS

## 2023-07-19 MED ORDER — ROCURONIUM BROMIDE 10 MG/ML (PF) SYRINGE
PREFILLED_SYRINGE | INTRAVENOUS | Status: DC | PRN
  Administered 2023-07-19: 10 mg via INTRAVENOUS
  Administered 2023-07-19: 40 mg via INTRAVENOUS

## 2023-07-19 MED ORDER — LACTATED RINGERS IV SOLN: INTRAVENOUS | Status: DC | PRN

## 2023-07-19 MED ORDER — ONDANSETRON HCL 4 MG/2ML IJ SOLN: 4.0000 mg | Freq: Once | INTRAMUSCULAR | Status: DC

## 2023-07-19 MED ORDER — DEXAMETHASONE SODIUM PHOSPHATE 10 MG/ML IJ SOLN
INTRAMUSCULAR | Status: DC | PRN
  Administered 2023-07-19: 10 mg via INTRAVENOUS

## 2023-07-19 MED ORDER — EPHEDRINE SULFATE-NACL 50-0.9 MG/10ML-% IV SOSY
PREFILLED_SYRINGE | INTRAVENOUS | Status: DC | PRN
  Administered 2023-07-19 (×2): 5 mg via INTRAVENOUS

## 2023-07-19 MED ORDER — SUCCINYLCHOLINE CHLORIDE 200 MG/10ML IV SOSY
PREFILLED_SYRINGE | INTRAVENOUS | Status: DC | PRN
  Administered 2023-07-19: 120 mg via INTRAVENOUS

## 2023-07-19 MED ORDER — CHLORHEXIDINE GLUCONATE 0.12 % MT SOLN
15.0000 mL | Freq: Once | OROMUCOSAL | Status: AC
  Administered 2023-07-19: 15 mL via OROMUCOSAL

## 2023-07-19 MED ORDER — SUCCINYLCHOLINE CHLORIDE 200 MG/10ML IV SOSY
PREFILLED_SYRINGE | INTRAVENOUS | Status: AC
  Filled 2023-07-19: qty 10

## 2023-07-19 MED ORDER — FENTANYL CITRATE (PF) 250 MCG/5ML IJ SOLN
INTRAMUSCULAR | Status: AC
  Filled 2023-07-19: qty 5

## 2023-07-19 MED ORDER — PROPOFOL 10 MG/ML IV BOLUS
INTRAVENOUS | Status: AC
Start: 2023-07-19 — End: ?
  Filled 2023-07-19: qty 20

## 2023-07-19 MED ORDER — LIDOCAINE 2% (20 MG/ML) 5 ML SYRINGE
INTRAMUSCULAR | Status: DC | PRN
  Administered 2023-07-19: 100 mg via INTRAVENOUS

## 2023-07-19 MED ORDER — OXYCODONE HCL 5 MG/5ML PO SOLN: 5.0000 mg | Freq: Once | ORAL | Status: AC | PRN

## 2023-07-19 MED ORDER — EPHEDRINE 5 MG/ML INJ
INTRAVENOUS | Status: AC
  Filled 2023-07-19: qty 5

## 2023-07-19 MED ORDER — LIDOCAINE HCL (PF) 2 % IJ SOLN
INTRAMUSCULAR | Status: AC
  Filled 2023-07-19: qty 5

## 2023-07-19 MED ORDER — SUGAMMADEX SODIUM 200 MG/2ML IV SOLN
INTRAVENOUS | Status: DC | PRN
  Administered 2023-07-19: 200 mg via INTRAVENOUS

## 2023-07-19 MED ORDER — ONDANSETRON HCL 4 MG/2ML IJ SOLN
4.0000 mg | Freq: Once | INTRAMUSCULAR | Status: AC | PRN
  Administered 2023-07-19: 4 mg via INTRAVENOUS

## 2023-07-19 MED ORDER — FENTANYL CITRATE PF 50 MCG/ML IJ SOSY: 25.0000 ug | PREFILLED_SYRINGE | INTRAMUSCULAR | Status: DC | PRN

## 2023-07-19 MED ORDER — CEFAZOLIN SODIUM-DEXTROSE 2-4 GM/100ML-% IV SOLN
INTRAVENOUS | Status: AC
  Filled 2023-07-19: qty 100

## 2023-07-19 MED ORDER — MIDAZOLAM HCL 2 MG/2ML IJ SOLN
INTRAMUSCULAR | Status: AC
  Filled 2023-07-19: qty 2

## 2023-07-19 MED ORDER — GLYCOPYRROLATE PF 0.2 MG/ML IJ SOSY
PREFILLED_SYRINGE | INTRAMUSCULAR | Status: DC | PRN
  Administered 2023-07-19 (×2): .2 mg via INTRAVENOUS

## 2023-07-19 MED ORDER — OXYCODONE HCL 5 MG PO TABS
5.0000 mg | ORAL_TABLET | Freq: Once | ORAL | Status: AC | PRN
  Administered 2023-07-19: 5 mg via ORAL
  Filled 2023-07-19: qty 1

## 2023-07-19 MED ORDER — ONDANSETRON HCL 4 MG/2ML IJ SOLN
INTRAMUSCULAR | Status: AC
  Filled 2023-07-19: qty 2

## 2023-07-19 SURGICAL SUPPLY — 36 items
BANDAGE ESMARK 6X9 LF (GAUZE/BANDAGES/DRESSINGS) IMPLANT
BENZOIN TINCTURE PRP APPL 2/3 (GAUZE/BANDAGES/DRESSINGS) ×1 IMPLANT
BLADE SURG SZ10 CARB STEEL (BLADE) IMPLANT
BNDG COHESIVE 4X5 TAN STRL (GAUZE/BANDAGES/DRESSINGS) ×1 IMPLANT
BNDG ELASTIC 6X5.8 VLCR NS LF (GAUZE/BANDAGES/DRESSINGS) IMPLANT
BNDG ESMARK 6X9 LF (GAUZE/BANDAGES/DRESSINGS) ×1
BNDG GAUZE DERMACEA FLUFF 4 (GAUZE/BANDAGES/DRESSINGS) IMPLANT
CHLORAPREP W/TINT 26 (MISCELLANEOUS) ×1 IMPLANT
CLOTH BEACON ORANGE TIMEOUT ST (SAFETY) ×1 IMPLANT
COVER LIGHT HANDLE STERIS (MISCELLANEOUS) ×2 IMPLANT
CUFF TOURN SGL QUICK 34 (TOURNIQUET CUFF) ×1
CUFF TRNQT CYL 34X4.125X (TOURNIQUET CUFF) ×1 IMPLANT
ELECT REM PT RETURN 9FT ADLT (ELECTROSURGICAL) ×1
ELECTRODE REM PT RTRN 9FT ADLT (ELECTROSURGICAL) ×1 IMPLANT
GAUZE SPONGE 4X4 12PLY STRL (GAUZE/BANDAGES/DRESSINGS) ×1 IMPLANT
GLOVE BIOGEL PI IND STRL 7.0 (GLOVE) ×2 IMPLANT
GLOVE BIOGEL PI IND STRL 8 (GLOVE) IMPLANT
GLOVE SS N UNI LF 8.5 STRL (GLOVE) ×1 IMPLANT
GLOVE SURG POLYISO LF SZ8 (GLOVE) ×1 IMPLANT
GLOVE SURG SS PI 8.0 STRL IVOR (GLOVE) IMPLANT
GOWN STRL REUS W/TWL LRG LVL3 (GOWN DISPOSABLE) ×1 IMPLANT
GOWN STRL REUS W/TWL XL LVL3 (GOWN DISPOSABLE) ×1 IMPLANT
KIT TURNOVER KIT A (KITS) ×1 IMPLANT
MANIFOLD NEPTUNE II (INSTRUMENTS) ×1 IMPLANT
NDL HYPO 21X1.5 SAFETY (NEEDLE) ×1 IMPLANT
NEEDLE HYPO 21X1.5 SAFETY (NEEDLE) ×1
NS IRRIG 1000ML POUR BTL (IV SOLUTION) ×1 IMPLANT
PACK BASIC LIMB (CUSTOM PROCEDURE TRAY) ×1 IMPLANT
PAD ARMBOARD 7.5X6 YLW CONV (MISCELLANEOUS) ×1 IMPLANT
SET BASIN LINEN APH (SET/KITS/TRAYS/PACK) ×1 IMPLANT
SPONGE T-LAP 18X18 ~~LOC~~+RFID (SPONGE) ×1 IMPLANT
STRIP CLOSURE SKIN 1/2X4 (GAUZE/BANDAGES/DRESSINGS) ×1 IMPLANT
SUT MNCRL AB 4-0 PS2 18 (SUTURE) IMPLANT
SUT MON AB 5-0 PS2 18 (SUTURE) IMPLANT
SYR 30ML LL (SYRINGE) ×1 IMPLANT
SYR BULB IRRIG 60ML STRL (SYRINGE) ×1 IMPLANT

## 2023-07-19 NOTE — Anesthesia Preprocedure Evaluation (Addendum)
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 negative neurological ROS     GI/Hepatic negative GI ROS,,,(+) Hepatitis -, B, A, CReported IVDA remotely  Denies current abuse of any drugs   Endo/Other    Renal/GU negative Renal ROS  negative genitourinary   Musculoskeletal  (+) Arthritis , Osteoarthritis,    Abdominal   Peds negative pediatric ROS (+)  Hematology negative hematology ROS (+)   Anesthesia Other Findings   Reproductive/Obstetrics negative OB ROS                             Anesthesia Physical Anesthesia Plan  ASA: 3  Anesthesia Plan: General   Post-op Pain Management:    Induction: Intravenous  PONV Risk Score and Plan: 1 and TIVA, Propofol infusion and Treatment may vary due to age or medical condition  Airway Management Planned: Nasal Cannula and Natural Airway  Additional Equipment:   Intra-op Plan:   Post-operative Plan:   Informed Consent: I have reviewed the patients History and Physical, chart, labs and discussed the procedure including the risks, benefits and alternatives for the proposed anesthesia with the patient or authorized representative who has indicated his/her understanding and acceptance.     Dental advisory given  Plan Discussed with: CRNA  Anesthesia Plan Comments: (Plan Full PPE use  Plan GA  with GETA as needed -d/w pt -WTP with same after Q&A)        Anesthesia Quick Evaluation

## 2023-07-19 NOTE — Op Note (Signed)
Orthopaedic Surgery Operative Note (CSN: [id number])  Devine Scurto  September 24, 1965 Date of Surgery: 07/19/2023  07/19/2023  11:14 AM  PATIENT:  Drew Kent  57 y.o. male  PRE-OPERATIVE DIAGNOSIS:  Ganglion cyst left knee  POST-OPERATIVE DIAGNOSIS:  Ganglion cyst left knee  PROCEDURE:  Procedure(s): EXCISION MASS LOWER EXTREMITIES/ ganglion cyst posterior left knee (Left)  SURGEON:  Surgeons and Role:    Vickki Hearing, MD - Primary  PHYSICIAN ASSISTANT:   ASSISTANTS: none   ANESTHESIA:   general  EBL:  none   BLOOD ADMINISTERED:none  DRAINS: none   LOCAL MEDICATIONS USED:  MARCAINE     SPECIMEN:  Source of Specimen:  posterior aspect of left knee, appeared to be coming from the knee joint   DISPOSITION OF SPECIMEN:  PATHOLOGY  COUNTS:  YES  TOURNIQUET:   Total Tourniquet Time Documented: Thigh (Left) - 25 minutes Total: Thigh (Left) - 25 minutes   DICTATION: .Reubin Milan Dictation  PLAN OF CARE: Discharge to home after PACU  PATIENT DISPOSITION:  PACU - hemodynamically stable.   Delay start of Pharmacological VTE agent (>24hrs) due to surgical blood loss or risk of bleeding: not applicable   Indications for Surgery:   Drew Kent is a 57 y.o. male who was very unhappy with the mass on the back of his knee and wanted it excised.  Benefits and risks of operative and nonoperative management were discussed prior to surgery with patient/guardian(s) and informed consent form was completed.  Specific risks including infection, need for additional surgery, as well as recurrence secondary to possibility of intra-articular pathology causing the cyst   Procedure:   The patient was identified properly. Informed consent was obtained and the surgical site was marked. The patient was taken to the OR where general anesthesia was induced.  The patient was positioned prone.  The left lower extremity was prepped and draped in the usual sterile fashion.  Timeout was performed before  the beginning of the case.  Tourniquet was used for the above duration.  I opted for a straight incision longitudinal incision secondary to the size of the mass.  The mass measured 4 x 3 cm.  Gelatinous fluid was found inside the cyst  After making a straight incision dividing subcutaneous tissue with blunt and sharp dissection the mass was dissected out from the underlying fascial tissue.  However there appeared to be a stalk in the subfascial area this was followed and I was able to express more fluid out of the lesion  After the lesion was totally excised  We irrigated the wound copiously.  We closed the incision in a multilayer fashion with absorbable suture.  4-0 Monocryl and 2-0 Monocryl sterile dressing was placed.  Steri-Strips applied   patient was awoken taken to PACU in stable condition.   Post-operative plan:   Postop appointment in 7 to 14 days wound check  May use Saran wrap to shower after 48 hours

## 2023-07-19 NOTE — Interval H&P Note (Signed)
History and Physical Interval Note:  07/19/2023 9:53 AM  Drew Kent  has presented today for surgery, with the diagnosis of Ganglion cyst left knee.  The various methods of treatment have been discussed with the patient and family. After consideration of risks, benefits and other options for treatment, the patient has consented to  Procedure(s): EXCISION MASS LOWER EXTREMITIES/ ganglion cyst posterior left knee (Left) as a surgical intervention.  The patient's history has been reviewed, patient examined, no change in status, stable for surgery.  I have reviewed the patient's chart and labs.  Questions were answered to the patient's satisfaction.     Fuller Canada

## 2023-07-19 NOTE — Anesthesia Postprocedure Evaluation (Signed)
Anesthesia Post Note  Patient: Drew Kent  Procedure(s) Performed: EXCISION MASS LOWER EXTREMITIES/ ganglion cyst posterior left knee (Left: Knee)  Patient location during evaluation: PACU Anesthesia Type: General Level of consciousness: awake and alert Pain management: pain level controlled Vital Signs Assessment: post-procedure vital signs reviewed and stable Respiratory status: spontaneous breathing, nonlabored ventilation, respiratory function stable and patient connected to nasal cannula oxygen Cardiovascular status: blood pressure returned to baseline and stable Postop Assessment: no apparent nausea or vomiting Anesthetic complications: no   There were no known notable events for this encounter.   Last Vitals:  Vitals:   07/19/23 1145 07/19/23 1157  BP: 132/84 (!) 141/97  Pulse: 77 70  Resp: (!) 21 15  Temp: (!) 36.3 C 36.4 C  SpO2: 96% 100%    Last Pain:  Vitals:   07/19/23 1157  TempSrc: Oral  PainSc: 8                  Elisabella Hacker L Zyquan Crotty

## 2023-07-19 NOTE — Anesthesia Procedure Notes (Signed)
Procedure Name: Intubation Date/Time: 07/19/2023 10:24 AM  Performed by: Izola Price., CRNAPre-anesthesia Checklist: Patient identified, Emergency Drugs available, Suction available and Patient being monitored Patient Re-evaluated:Patient Re-evaluated prior to induction Oxygen Delivery Method: Circle system utilized Preoxygenation: Pre-oxygenation with 100% oxygen Induction Type: IV induction Ventilation: Mask ventilation without difficulty Laryngoscope Size: Mac and 4 Grade View: Grade II Tube type: Oral Tube size: 7.5 mm Number of attempts: 1 Airway Equipment and Method: Stylet and Oral airway Placement Confirmation: ETT inserted through vocal cords under direct vision, positive ETCO2 and breath sounds checked- equal and bilateral Secured at: 23 cm Tube secured with: Tape Dental Injury: Teeth and Oropharynx as per pre-operative assessment

## 2023-07-19 NOTE — Discharge Instructions (Signed)

## 2023-07-19 NOTE — Brief Op Note (Signed)
07/19/2023  11:14 AM  PATIENT:  Drew Kent  57 y.o. male  PRE-OPERATIVE DIAGNOSIS:  Ganglion cyst left knee  POST-OPERATIVE DIAGNOSIS:  Ganglion cyst left knee  PROCEDURE:  Procedure(s): EXCISION MASS LOWER EXTREMITIES/ ganglion cyst posterior left knee (Left)  SURGEON:  Surgeons and Role:    Vickki Hearing, MD - Primary  PHYSICIAN ASSISTANT:   ASSISTANTS: none   ANESTHESIA:   general  EBL:  none   BLOOD ADMINISTERED:none  DRAINS: none   LOCAL MEDICATIONS USED:  MARCAINE     SPECIMEN:  Source of Specimen:  posterior aspect of left knee, appeared to be coming from the knee joint   DISPOSITION OF SPECIMEN:  PATHOLOGY  COUNTS:  YES  TOURNIQUET:   Total Tourniquet Time Documented: Thigh (Left) - 25 minutes Total: Thigh (Left) - 25 minutes   DICTATION: .Reubin Milan Dictation  PLAN OF CARE: Discharge to home after PACU  PATIENT DISPOSITION:  PACU - hemodynamically stable.   Delay start of Pharmacological VTE agent (>24hrs) due to surgical blood loss or risk of bleeding: not applicable

## 2023-07-19 NOTE — Transfer of Care (Signed)
Immediate Anesthesia Transfer of Care Note  Patient: Drew Kent  Procedure(s) Performed: EXCISION MASS LOWER EXTREMITIES/ ganglion cyst posterior left knee (Left: Knee)  Patient Location: PACU  Anesthesia Type:General  Level of Consciousness: awake, alert , and oriented  Airway & Oxygen Therapy: Patient Spontanous Breathing and Patient connected to face mask oxygen  Post-op Assessment: Report given to RN and Post -op Vital signs reviewed and stable  Post vital signs: Reviewed and stable  Last Vitals:  Vitals Value Taken Time  BP 128/93 07/19/23 1123  Temp    Pulse 84 07/19/23 1126  Resp 13 07/19/23 1126  SpO2 99 % 07/19/23 1126  Vitals shown include unfiled device data.  Last Pain:  Vitals:   07/19/23 0856  TempSrc: Oral  PainSc: 7          Complications: No notable events documented.

## 2023-07-20 LAB — SURGICAL PATHOLOGY

## 2023-07-25 ENCOUNTER — Encounter (HOSPITAL_COMMUNITY): Payer: Self-pay | Admitting: Orthopedic Surgery

## 2023-07-26 ENCOUNTER — Ambulatory Visit (INDEPENDENT_AMBULATORY_CARE_PROVIDER_SITE_OTHER): Payer: MEDICAID | Admitting: Orthopedic Surgery

## 2023-07-26 DIAGNOSIS — M6789 Other specified disorders of synovium and tendon, multiple sites: Secondary | ICD-10-CM

## 2023-07-26 DIAGNOSIS — M6749 Ganglion, multiple sites: Secondary | ICD-10-CM

## 2023-07-26 DIAGNOSIS — M7139 Other bursal cyst, multiple sites: Secondary | ICD-10-CM

## 2023-08-02 NOTE — Progress Notes (Signed)
   VISIT TYPE: POST OP VISIT   Chief Complaint  Patient presents with   Follow-up    Recheck on left knee, DOS 07-19-23.    Encounter Diagnosis  Name Primary?   Ganglion and cyst of synovium, tendon and bursa left knee/ excision  07/19/23 Yes    PROCEDURE:  Excision of mass left knee   Path report benign cyst     POV # 1   Current Outpatient Medications:    acetaminophen (TYLENOL) 325 MG tablet, Take 650 mg by mouth every 6 (six) hours as needed for moderate pain (pain score 4-6)., Disp: , Rfl:    Magnesium Oxide -Mg Supplement 250 MG TABS, Take 250 mg by mouth in the morning and at bedtime., Disp: , Rfl:    Subjective  no complaints   Incx clean  Normal neurovascular exam and normal ROM   ASSESSMENT AND PLAN   Return to work in a few days   Follow as needed

## 2024-02-09 ENCOUNTER — Encounter (INDEPENDENT_AMBULATORY_CARE_PROVIDER_SITE_OTHER): Payer: Self-pay | Admitting: *Deleted

## 2024-04-27 ENCOUNTER — Encounter: Payer: Self-pay | Admitting: Radiology

## 2024-07-09 ENCOUNTER — Encounter: Payer: Self-pay | Admitting: Radiology
